# Patient Record
Sex: Female | Born: 1995 | Race: White | Hispanic: No | Marital: Single | Smoking: Never smoker
Health system: Southern US, Community
[De-identification: ages and names within clinical notes are randomized; demographics above are authoritative.]

## PROBLEM LIST (undated history)

## (undated) DIAGNOSIS — F419 Anxiety disorder, unspecified: Secondary | ICD-10-CM

## (undated) DIAGNOSIS — F99 Mental disorder, not otherwise specified: Secondary | ICD-10-CM

## (undated) DIAGNOSIS — F32A Depression, unspecified: Secondary | ICD-10-CM

## (undated) DIAGNOSIS — F319 Bipolar disorder, unspecified: Secondary | ICD-10-CM

## (undated) DIAGNOSIS — Z803 Family history of malignant neoplasm of breast: Secondary | ICD-10-CM

## (undated) DIAGNOSIS — L709 Acne, unspecified: Secondary | ICD-10-CM

## (undated) HISTORY — DX: Family history of malignant neoplasm of breast: Z80.3

## (undated) HISTORY — DX: Acne, unspecified: L70.9

## (undated) HISTORY — PX: TONSILLECTOMY: SUR1361

---
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
## 2016-08-17 ENCOUNTER — Emergency Department (HOSPITAL_COMMUNITY)
Admission: EM | Admit: 2016-08-17 | Discharge: 2016-08-17 | Disposition: A | Discharge status: Home / Self Care | Attending: Emergency Medicine | Admitting: Emergency Medicine

## 2016-08-17 ENCOUNTER — Encounter: Payer: Self-pay | Admitting: Emergency Medicine

## 2016-08-17 DIAGNOSIS — O368120 Decreased fetal movements, second trimester, not applicable or unspecified: Secondary | ICD-10-CM | POA: Insufficient documentation

## 2016-08-17 DIAGNOSIS — Z3A25 25 weeks gestation of pregnancy: Secondary | ICD-10-CM | POA: Diagnosis not present

## 2016-08-17 DIAGNOSIS — Z3492 Encounter for supervision of normal pregnancy, unspecified, second trimester: Secondary | ICD-10-CM

## 2016-08-17 HISTORY — DX: Mental disorder, not otherwise specified: F99

## 2016-08-17 NOTE — Progress Notes (Addendum)
1215 Arrived to evaluate this 20 yo G1P0 @ 25.[redacted] wks GA in with complaint of no fetal movement for 2 days.  Patient remains in waiting room upon arrival of [facility] RN.  1235  Bedside limited U/S performed by ED provider.  Fetal movement and heartbeat detected.  1241  EFM placed. 1310 Dr. Debroah LoopArnold of FP notified of above and of reassuring FHR for GA.  He reviewed the fetal monitoring strip at this time and indicates that the patient can be OB cleared at this time.

## 2016-08-17 NOTE — ED Triage Notes (Signed)
Pt reports she is [redacted] weeks pregnant and has not felt her baby move for two days. She denies any abdominal pain, vaginal bleeding or leakage of any fluids. She [place] [place].

## 2016-08-17 NOTE — Discharge Instructions (Signed)
Please follow up with your Primary OB/Gyn as needed. If you have further issues while in [place], please [facility]

## 2016-08-17 NOTE — ED Notes (Signed)
OB rapid response at bedside 

## 2016-08-17 NOTE — ED Triage Notes (Signed)
This RN has called [facility] and spoke with Denny PeonErin, RN to inform her that pt is here. Denny Peonrin states she will be here shortly to see patient.

## 2016-08-17 NOTE — ED Provider Notes (Signed)
[facility]PT Provider Note   CSN: [id number] Arrival date & time: 08/17/16  1131     History   Chief Complaint Chief Complaint  Patient presents with  . Pregnancy problem     HPI   Chelsea Vargas is a 20 y.o. G1P0 female with bipolar disorder and anxiety and pregnancy at 25 weeks presents for inability to feel her baby move for the last two days She denies contractions, vaginal bleeding, vaginal irritation or discharge dysuria. She states she has had prenatal care in [place] but is [facility] visiting a family member who is in the hospital. She denies recent illness or fevers, nausea, emesis  No past medical history on file.  There are no active problems to display for this patient.   No past surgical history on file.  OB History    Gravida Para Term Preterm AB Living   1             SAB TAB Ectopic Multiple Live Births                   Home Medications    Prior to Admission medications   Not on File    Family History No family history on file.  Social History Social History  Substance Use Topics  . Smoking status: Not on file  . Smokeless tobacco: Not on file  . Alcohol use Not on file     Allergies   Review of patient's allergies indicates not on file.   Review of Systems Review of Systems  Constitutional: Negative.   HENT: Negative.   Respiratory: Negative.   Cardiovascular: Negative.   Gastrointestinal: Negative.   Genitourinary: Negative.   Musculoskeletal: Negative.   Skin: Negative.   Neurological: Negative.      Physical Exam Updated Vital Signs BP 123/74 (BP Location: Right Arm)   Pulse 93   Temp 98.5 F (36.9 C) (Oral)   Resp 18   LMP 02/04/2016   SpO2 98%   Physical Exam  Constitutional: She is oriented to person, place, and time. She appears well-developed and well-nourished.  HENT:  Head: Normocephalic and atraumatic.  Eyes: EOM are normal. Pupils are equal, round, and reactive to light.  Neck:  Normal range of motion.  Cardiovascular: Normal rate, regular rhythm and normal heart sounds.   No murmur heard. Pulmonary/Chest: Effort normal and breath sounds normal. No respiratory distress.  Abdominal: Soft. Bowel sounds are normal. She exhibits no distension. There is no tenderness.  Genitourinary: Vagina normal and uterus normal.  Musculoskeletal: Normal range of motion.  Neurological: She is alert and oriented to person, place, and time.  Psychiatric: She has a normal mood and affect.     ED Treatments / Results  Labs (all labs ordered are listed, but only abnormal results are displayed) Labs Reviewed - No data to display  EKG  EKG Interpretation None       Radiology No results found.  Procedures Procedures (including critical care time)  Medications Ordered in ED Medications - No data to display   Initial Impression / Assessment and Plan / ED Course  I have reviewed the triage vital signs and the nursing notes.  Pertinent labs & imaging results that were available during my care of the patient were reviewed by me and considered in my medical decision making (see chart for details).  Clinical Course   Bedside US showed fetal heart rate in the 140s, vigorous fetus and good amniotic fluid  Final Clinical Impressions(s) / ED Diagnoses   Final diagnoses:  None   20 y/o G1P0 at 25 weeks presents for concern of no fetal movements for 2 days. Bedside US showed normal fetus. The patient was evaluated by the [facility] nurse and placed on the fetal monitors for 20 minutes. She was given strong ED and OB return precautions and was discharged in stable condition  New Prescriptions New Prescriptions   No medications on file    Kyson Kupper A. Kennon Rounds MD, MS Family Medicine Resident PGY-3 Pager [phone]    Bonney Aid, MD 08/17/16 1249    Jacalyn Lefevre, MD 08/17/16 1319    Jacalyn Lefevre, MD 08/22/16 [phone]

## 2016-12-24 ENCOUNTER — Encounter (HOSPITAL_COMMUNITY): Payer: Self-pay | Admitting: Emergency Medicine

## 2016-12-24 ENCOUNTER — Emergency Department (HOSPITAL_COMMUNITY)
Admission: EM | Admit: 2016-12-24 | Discharge: 2016-12-25 | Disposition: A | Discharge status: Home / Self Care | Attending: Emergency Medicine | Admitting: Emergency Medicine

## 2016-12-24 DIAGNOSIS — R1031 Right lower quadrant pain: Secondary | ICD-10-CM | POA: Insufficient documentation

## 2016-12-24 DIAGNOSIS — R1030 Lower abdominal pain, unspecified: Secondary | ICD-10-CM

## 2016-12-24 LAB — CBC
HEMATOCRIT: 36.5 % (ref 36.0–46.0)
Hemoglobin: 11.2 g/dL — ABNORMAL LOW (ref 12.0–15.0)
MCH: 25.1 pg — ABNORMAL LOW (ref 26.0–34.0)
MCHC: 30.7 g/dL (ref 30.0–36.0)
MCV: 81.7 fL (ref 78.0–100.0)
PLATELETS: 292 10*3/uL (ref 150–400)
RBC: 4.47 MIL/uL (ref 3.87–5.11)
RDW: 13.2 % (ref 11.5–15.5)
WBC: 9.3 10*3/uL (ref 4.0–10.5)

## 2016-12-24 NOTE — ED Provider Notes (Signed)
[facility] Provider Note   CSN: [id number] Arrival date & time: 12/24/16  2230 By signing my name below, I, Chelsea Vargas, attest that this documentation has been prepared under the direction and in the presence of Gilda Crease, MD. Electronically Signed: Bridgette Vargas, ED Scribe. 12/25/16. 12:04 AM.  History   Chief Complaint Chief Complaint  Patient presents with  . Abdominal Pain    HPI The history is provided by the patient. No language interpreter was used.   HPI Comments: Chelsea Vargas is a 21 y.o. female with no pertinent PMHx, who presents to the Emergency Department complaining of RLQ abdominal pain onset last night. Mother at bedside reports that pt took a nap yesterday afternoon and woke up with this pain. Pain is exacerbated with movement and ambulating. Additionally, mother states that pt had vaginal yellow discharge that began two days ago with some mild dysuria. No aleviating factors noted. Mother notes that pt had a C-section 4 weeks ago and has been recovering well; she has not taken pain medication in 2 weeks. Denies any drainage from incision site. Denies h/o ovarian cysts. Pt further denies nausea, vomiting, fever, chills, or any other associated symptoms.   Past Medical History:  Diagnosis Date  . Mental disorder    Bipolar    There are no active problems to display for this patient.   Past Surgical History:  Procedure Laterality Date  . CESAREAN SECTION    . TONSILLECTOMY      OB History    Gravida Para Term Preterm AB Living   1             SAB TAB Ectopic Multiple Live Births                   Home Medications    Prior to Admission medications   Medication Sig Start Date End Date Taking? Authorizing Provider  Prenatal Vit-Fe Fumarate-FA (PRENATAL MULTIVITAMIN) TABS tablet Take 1 tablet by mouth daily at 12 noon.   Yes Historical Provider, MD  sertraline (ZOLOFT) 50 MG tablet Take 50 mg by mouth daily.   Yes Historical Provider, MD    amoxicillin-clavulanate (AUGMENTIN) 875-125 MG tablet Take 1 tablet by mouth 2 (two) times daily. 12/25/16   Gilda Crease, MD  HYDROcodone-acetaminophen (NORCO/VICODIN) 5-325 MG tablet Take 1-2 tablets by mouth every 4 (four) hours as needed for moderate pain. 12/25/16   Gilda Crease, MD    Family History No family history on file.  Social History Social History  Substance Use Topics  . Smoking status: Never Smoker  . Smokeless tobacco: Never Used  . Alcohol use No     Allergies   Patient has no known allergies.   Review of Systems Review of Systems  Constitutional: Negative for chills and fever.  Gastrointestinal: Positive for abdominal pain. Negative for nausea and vomiting.  Genitourinary: Positive for dysuria and vaginal discharge.  All other systems reviewed and are negative.  Physical Exam Updated Vital Signs BP (!) 92/53   Pulse 66   Temp 97.9 F (36.6 C) (Oral)   Resp 16   Ht 5\' 3"  (1.6 m)   Wt 150 lb (68 kg)   LMP 02/04/2016   SpO2 99%   Breastfeeding? No   BMI 26.57 kg/m   Physical Exam  Constitutional: She is oriented to person, place, and time. She appears well-developed and well-nourished. No distress.  HENT:  Head: Normocephalic and atraumatic.  Right Ear: Hearing normal.  Left  Ear: Hearing normal.  Nose: Nose normal.  Mouth/Throat: Oropharynx is clear and moist and mucous membranes are normal.  Eyes: Conjunctivae and EOM are normal. Pupils are equal, round, and reactive to light.  Neck: Normal range of motion. Neck supple.  Cardiovascular: Regular rhythm, S1 normal and S2 normal.  Exam reveals no gallop and no friction rub.   No murmur heard. Pulmonary/Chest: Effort normal and breath sounds normal. No respiratory distress. She exhibits no tenderness.  Abdominal: Soft. Normal appearance and bowel sounds are normal. There is no hepatosplenomegaly. There is tenderness in the right lower quadrant. There is no rebound, no guarding, no  tenderness at McBurney's point and negative Murphy's sign. No hernia.  Genitourinary: Cervix exhibits no motion tenderness and no discharge. Right adnexum displays no mass. Left adnexum displays no mass.  Musculoskeletal: Normal range of motion.  Neurological: She is alert and oriented to person, place, and time. She has normal strength. No cranial nerve deficit or sensory deficit. Coordination normal. GCS eye subscore is 4. GCS verbal subscore is 5. GCS motor subscore is 6.  Skin: Skin is warm, dry and intact. No rash noted. No cyanosis.  Psychiatric: She has a normal mood and affect. Her speech is normal and behavior is normal. Thought content normal.  Nursing note and vitals reviewed.    ED Treatments / Results  DIAGNOSTIC STUDIES: Oxygen Saturation is 99% on RA, normal by my interpretation.    COORDINATION OF CARE: 12:02 AM Discussed treatment plan with pt at bedside which includes lab work and abdomen CT and pt agreed to plan.  Labs (all labs ordered are listed, but only abnormal results are displayed) Labs Reviewed  WET PREP, GENITAL - Abnormal; Notable for the following:       Result Value   WBC, Wet Prep HPF POC MANY (*)    All other components within normal limits  COMPREHENSIVE METABOLIC PANEL - Abnormal; Notable for the following:    ALT 13 (*)    All other components within normal limits  CBC - Abnormal; Notable for the following:    Hemoglobin 11.2 (*)    MCH 25.1 (*)    All other components within normal limits  URINALYSIS, ROUTINE W REFLEX MICROSCOPIC - Abnormal; Notable for the following:    APPearance HAZY (*)    Leukocytes, UA TRACE (*)    Bacteria, UA RARE (*)    Squamous Epithelial / LPF 6-30 (*)    All other components within normal limits  LIPASE, BLOOD  POC URINE PREG, ED  GC/CHLAMYDIA PROBE AMP ([place]) NOT AT [facility]    EKG  EKG Interpretation None       Radiology Ct Abdomen Pelvis W Contrast  Result Date: 12/25/2016 CLINICAL DATA:  Right  lower quadrant pain.  Recent Cesarean section. EXAM: CT ABDOMEN AND PELVIS WITH CONTRAST TECHNIQUE: Multidetector CT imaging of the abdomen and pelvis was performed using the standard protocol following bolus administration of intravenous contrast. CONTRAST:  ISOVUE-300 IOPAMIDOL (ISOVUE-300) INJECTION 61% COMPARISON:  None. FINDINGS: Lower chest: No acute abnormality. Hepatobiliary: No focal liver abnormality is seen. No gallstones, gallbladder wall thickening, or biliary dilatation. Pancreas: Unremarkable. No pancreatic ductal dilatation or surrounding inflammatory changes. Spleen: Normal in size without focal abnormality. Adrenals/Urinary Tract: Adrenal glands are unremarkable. Kidneys are normal, without renal calculi, focal lesion, or hydronephrosis. Bladder is unremarkable. Stomach/Bowel: Stomach is within normal limits. Appendix appears normal. No evidence of bowel wall thickening, distention, or inflammatory changes. Vascular/Lymphatic: No significant vascular findings are  present. No enlarged abdominal or pelvic lymph nodes. Reproductive: Mildly prominent uterine size, consistent with the provided history. C-section scar is visible at the anterior uterus. No hematoma, abscess or other abnormal postoperative collection is evident. Physiologic appearing ovarian cysts or follicles are present bilaterally. Other: No focal inflammation. Fat containing umbilical hernia incidentally noted. Musculoskeletal: No significant skeletal lesions. IMPRESSION: Unremarkable appearances of the uterus, given the history of recent Cesarean section. No evidence of significant complication. No acute findings. Small fat containing umbilical hernia incidentally noted. Electronically Signed   By: Ellery Plunkaniel R Mitchell M.D.   On: 12/25/2016 03:49    Procedures Procedures (including critical care time)  Medications Ordered in ED Medications  iopamidol (ISOVUE-300) 61 % injection (not administered)  morphine 4 MG/ML  injection 2 mg (2 mg Intravenous Given 12/25/16 0100)  ondansetron (ZOFRAN) injection 4 mg (4 mg Intravenous Given 12/25/16 0100)  iopamidol (ISOVUE-300) 61 % injection (100 mLs  Contrast Given 12/25/16 0329)     Initial Impression / Assessment and Plan / ED Course  I have reviewed the triage vital signs and the nursing notes.  Pertinent labs & imaging results that were available during my care of the patient were reviewed by me and considered in my medical decision making (see chart for details).     Patient presents to the emergency department for evaluation of right-sided abdominal and pelvic pain. Patient had C-section performed in [place] 4 weeks ago. She had been doing well postoperatively. No redness or drainage from the surgical site. She still has some yellowish vaginal discharge that has been present since C-section. She does report some odor. She has not had any fevers. Pelvic exam did not reveal any significant uterine tenderness and no significant discharge was noted. Low suspicion for endometritis, but patient is currently visiting here and does not have access to follow-up with her OB/GYN until next week when she goes back to [place]. Will therefore empirically cover with antibiotics.  Final Clinical Impressions(s) / ED Diagnoses   Final diagnoses:  Lower abdominal pain    New Prescriptions New Prescriptions   AMOXICILLIN-CLAVULANATE (AUGMENTIN) 875-125 MG TABLET    Take 1 tablet by mouth 2 (two) times daily.   HYDROCODONE-ACETAMINOPHEN (NORCO/VICODIN) 5-325 MG TABLET    Take 1-2 tablets by mouth every 4 (four) hours as needed for moderate pain.   I personally performed the services described in this documentation, which was scribed in my presence. The recorded information has been reviewed and is accurate.     Gilda Creasehristopher J Karon Heckendorn, MD 12/25/16 [phone]

## 2016-12-24 NOTE — ED Triage Notes (Signed)
Patient with lower right quadrant abdominal pain.  Patient denies any nausea or vomiting at this time.  Patient states she had a C section 4 weeks ago.  No drainage from incision site per patient.  Patient states that she has some vaginal discharge.

## 2016-12-25 ENCOUNTER — Emergency Department (HOSPITAL_COMMUNITY)

## 2016-12-25 LAB — COMPREHENSIVE METABOLIC PANEL
ALBUMIN: 3.9 g/dL (ref 3.5–5.0)
ALK PHOS: 81 U/L (ref 38–126)
ALT: 13 U/L — ABNORMAL LOW (ref 14–54)
ANION GAP: 9 (ref 5–15)
AST: 18 U/L (ref 15–41)
BILIRUBIN TOTAL: 0.3 mg/dL (ref 0.3–1.2)
BUN: 11 mg/dL (ref 6–20)
CALCIUM: 9.4 mg/dL (ref 8.9–10.3)
CO2: 23 mmol/L (ref 22–32)
Chloride: 108 mmol/L (ref 101–111)
Creatinine, Ser: 0.68 mg/dL (ref 0.44–1.00)
GFR calc Af Amer: >60 mL/min (ref >60)
GLUCOSE: 84 mg/dL (ref 65–99)
Potassium: 4 mmol/L (ref 3.5–5.1)
Sodium: 140 mmol/L (ref 135–145)
Total Protein: 6.5 g/dL (ref 6.5–8.1)

## 2016-12-25 LAB — WET PREP, GENITAL
CLUE CELLS WET PREP: NONE SEEN
SPERM: NONE SEEN
TRICH WET PREP: NONE SEEN
YEAST WET PREP: NONE SEEN

## 2016-12-25 LAB — URINALYSIS, ROUTINE W REFLEX MICROSCOPIC
Bilirubin Urine: NEGATIVE
Glucose, UA: NEGATIVE mg/dL
Hgb urine dipstick: NEGATIVE
Ketones, ur: NEGATIVE mg/dL
Nitrite: NEGATIVE
PH: 6 (ref 5.0–8.0)
Protein, ur: NEGATIVE mg/dL
SPECIFIC GRAVITY, URINE: 1.018 (ref 1.005–1.030)

## 2016-12-25 LAB — POC URINE PREG, ED: Preg Test, Ur: NEGATIVE

## 2016-12-25 LAB — LIPASE, BLOOD: Lipase: 25 U/L (ref 11–51)

## 2016-12-25 MED ORDER — ONDANSETRON HCL 4 MG/2ML IJ SOLN
4.0000 mg | Freq: Once | INTRAMUSCULAR | Status: AC
  Administered 2016-12-25: 4 mg via INTRAVENOUS
  Filled 2016-12-25: qty 2

## 2016-12-25 MED ORDER — HYDROCODONE-ACETAMINOPHEN 5-325 MG PO TABS: 1.0000 | ORAL_TABLET | ORAL | 0 refills | Status: DC | PRN

## 2016-12-25 MED ORDER — IOPAMIDOL (ISOVUE-300) INJECTION 61%
INTRAVENOUS | Status: AC
  Administered 2016-12-25: 100 mL
  Filled 2016-12-25: qty 100

## 2016-12-25 MED ORDER — AMOXICILLIN-POT CLAVULANATE 875-125 MG PO TABS: 1.0000 | ORAL_TABLET | Freq: Two times a day (BID) | ORAL | 0 refills | Status: DC

## 2016-12-25 MED ORDER — MORPHINE SULFATE (PF) 4 MG/ML IV SOLN
2.0000 mg | Freq: Once | INTRAVENOUS | Status: AC
  Administered 2016-12-25: 2 mg via INTRAVENOUS
  Filled 2016-12-25: qty 1

## 2016-12-25 MED ORDER — IOPAMIDOL (ISOVUE-300) INJECTION 61%
INTRAVENOUS | Status: AC
  Filled 2016-12-25: qty 30

## 2016-12-25 NOTE — ED Notes (Signed)
ED Provider at bedside. 

## 2016-12-25 NOTE — ED Notes (Signed)
Patient transported to CT 

## 2016-12-25 NOTE — ED Notes (Signed)
Pelvic cart outside room & ready for exam.

## 2016-12-25 NOTE — ED Notes (Signed)
Drink second bottle at 0230, scan at 0300

## 2016-12-26 LAB — GC/CHLAMYDIA PROBE AMP (~~LOC~~) NOT AT ARMC
Chlamydia: NEGATIVE
Neisseria Gonorrhea: NEGATIVE

## 2018-01-02 ENCOUNTER — Other Ambulatory Visit: Payer: Self-pay | Admitting: Obstetrics and Gynecology

## 2018-01-02 DIAGNOSIS — R102 Pelvic and perineal pain: Secondary | ICD-10-CM

## 2018-01-03 ENCOUNTER — Encounter: Payer: Self-pay | Admitting: Obstetrics and Gynecology

## 2018-01-03 ENCOUNTER — Ambulatory Visit (INDEPENDENT_AMBULATORY_CARE_PROVIDER_SITE_OTHER)

## 2018-01-03 ENCOUNTER — Ambulatory Visit (INDEPENDENT_AMBULATORY_CARE_PROVIDER_SITE_OTHER): Admitting: Obstetrics and Gynecology

## 2018-01-03 VITALS — BP 130/78 | HR 68 | Ht 64.0 in | Wt 200.0 lb

## 2018-01-03 DIAGNOSIS — Z113 Encounter for screening for infections with a predominantly sexual mode of transmission: Secondary | ICD-10-CM

## 2018-01-03 DIAGNOSIS — N7011 Chronic salpingitis: Secondary | ICD-10-CM | POA: Diagnosis not present

## 2018-01-03 DIAGNOSIS — R102 Pelvic and perineal pain: Secondary | ICD-10-CM

## 2018-01-03 DIAGNOSIS — N898 Other specified noninflammatory disorders of vagina: Secondary | ICD-10-CM

## 2018-01-03 LAB — POCT WET PREP WITH KOH
CLUE CELLS WET PREP PER HPF POC: NEGATIVE
KOH Prep POC: NEGATIVE
Trichomonas, UA: NEGATIVE
Yeast Wet Prep HPF POC: NEGATIVE

## 2018-01-03 MED ORDER — METRONIDAZOLE 0.75 % VA GEL: 1.0000 | Freq: Every day | VAGINAL | 1 refills | Status: AC

## 2018-01-03 NOTE — Progress Notes (Signed)
Chief Complaint  Patient presents with  . Pain in ovary    U/S today    HPI:      [name] is a 22 y.o. G1P0 who LMP was No LMP recorded., presents today for pelvic pain for the past 2 wks. Pain is sharp and constant, affecting sleep. No meds taken for sx. She has also noticed a vaginal non-fishy odor for 7 months and has seen other GYNs without any dx or tx. No vag d/c, irritation. Uses scented body wash vaginally. No urin sx. No fevers. Has had LBP for 7 months as well as LUQ pain. Does have frequent diarrhea, sx for "awhile".   Menses are monthly, last 4 days. No dysmen. Sex active, uses OCPs. No new partners. Has had neg STD testing in last yr. Has had dyspareunia recently.    Past Medical History:  Diagnosis Date  . Mental disorder    Bipolar    Past Surgical History:  Procedure Laterality Date  . CESAREAN SECTION    . TONSILLECTOMY      History reviewed. No pertinent family history.  Social History   Socioeconomic History  . Marital status: Single    Spouse name: Not on file  . Number of children: Not on file  . Years of education: Not on file  . Highest education level: Not on file  Social Needs  . Financial resource strain: Not on file  . Food insecurity - worry: Not on file  . Food insecurity - inability: Not on file  . Transportation needs - medical: Not on file  . Transportation needs - non-medical: Not on file  Occupational History  . Not on file  Tobacco Use  . Smoking status: Never Smoker  . Smokeless tobacco: Never Used  Substance and Sexual Activity  . Alcohol use: No  . Drug use: No  . Sexual activity: Yes  Other Topics Concern  . Not on file  Social History Narrative  . Not on file     Current Outpatient Medications:  .  citalopram (CELEXA) 20 MG tablet, TK 2 TS ONCE DAILY   -STOP LEXAPRO, Disp: , Rfl: 0 .  ESTARYLLA 0.25-35 MG-MCG tablet, TK 1 T PO QD, Disp: , Rfl: 12 .  metroNIDAZOLE (METROGEL) 0.75 % vaginal gel, Place 1  Applicatorful vaginally at bedtime for 5 days., Disp: 70 g, Rfl: 1   ROS:  Review of Systems  Constitutional: Negative for fever.  Gastrointestinal: Positive for diarrhea. Negative for blood in stool, constipation, nausea and vomiting.  Genitourinary: Positive for pelvic pain. Negative for dyspareunia, dysuria, flank pain, frequency, hematuria, urgency, vaginal bleeding, vaginal discharge and vaginal pain.  Musculoskeletal: Positive for back pain.  Skin: Negative for rash.     OBJECTIVE:   Vitals:  BP 130/78 (BP Location: Left Arm, Patient Position: Sitting, Cuff Size: Normal)   Pulse 68   Ht 5\' 4"  (1.626 m)   Wt 200 lb (90.7 kg)   BMI 34.33 kg/m   Physical Exam  Constitutional: She is oriented to person, place, and time and well-developed, well-nourished, and in no distress. Vital signs are normal.  Abdominal: Soft. Normal appearance. She exhibits no mass. There is tenderness in the right lower quadrant, suprapubic area and left lower quadrant. There is no guarding.  Genitourinary: Vagina normal, cervix normal and vulva normal. Uterus is tender. Uterus is not enlarged. Cervix exhibits no motion tenderness and no tenderness. Right adnexum displays tenderness. Right adnexum displays no mass. Left adnexum  displays tenderness. Left adnexum displays no mass. Vulva exhibits no erythema, no exudate, no lesion, no rash and no tenderness. Vagina exhibits no lesion. No vaginal discharge found.  Neurological: She is oriented to person, place, and time.  Vitals reviewed.   Results: ULTRASOUND REPORT  Location: [facility]  Date of Service: 01/03/2018    Indications:Pelvic Pain Findings:  The uterus is anteverted and measures 9.0 x 5.10 x 4.33cm. Echo texture is homogenous without evidence of focal masses.  The Endometrium measures 4.55 mm.  Right Ovary measures 2.75 x 2.06 x 1.37 cm. It is normal in appearance. - Right fallopian tube is dilated (.64 x .76cm) Left Ovary  measures 2.78 x 2.00 x 1.92 cm. It is normal in appearance. Survey of the adnexa demonstrates no adnexal masses. There is no free fluid in the cul de sac.  Impression: 1. Right hydrosalpinx, otherwise normal gyn ultrasound  Recommendations: 1.Clinical correlation with the patient's History and Physical Exam.   Willette AlmaKristen Priestley, RDMS, RVT  Results for orders placed or performed in visit on 01/03/18 (from the past 24 hour(s))  POCT Wet Prep with KOH     Status: Normal   Collection Time: 01/03/18  5:09 PM  Result Value Ref Range   Trichomonas, UA Negative    Clue Cells Wet Prep HPF POC neg    Epithelial Wet Prep HPF POC  Few, Moderate, Many, Too numerous to count   Yeast Wet Prep HPF POC neg    Bacteria Wet Prep HPF POC  Few   RBC Wet Prep HPF POC     WBC Wet Prep HPF POC     KOH Prep POC Negative Negative     Assessment/Plan: Pelvic pain - RT hydrosalpinx on u/s. Check nuswab. Will f/u with resutls. NSAIDs.  - Plan: US PELVIS TRANSVANGINAL NON-OB (TV ONLY), Chlamydia/Gonococcus/Trichomonas, NAA  Hydrosalpinx - Plan: Chlamydia/Gonococcus/Trichomonas, NAA  Screening for STD (sexually transmitted disease) - Plan: Chlamydia/Gonococcus/Trichomonas, NAA  Vaginal odor - NEg wet prep. Treat empirically with Rx metrogel. Will f/u with sx with STD results. If no improvement, will send One Swab AV, BV culture. Dove sens skin soap - Plan: POCT Wet Prep with KOH, metroNIDAZOLE (METROGEL) 0.75 % vaginal gel    Meds ordered this encounter  Medications  . metroNIDAZOLE (METROGEL) 0.75 % vaginal gel    Sig: Place 1 Applicatorful vaginally at bedtime for 5 days.    Dispense:  70 g    Refill:  1      Return if symptoms worsen or fail to improve.  Garland Smouse B. Alexah Kivett, PA-C 01/03/2018 5:12 PM

## 2018-01-03 NOTE — Patient Instructions (Signed)
I value your feedback and entrusting us with your care. If you get a [facility] patient survey, I would appreciate you taking the time to let us know about your experience today. Thank you! 

## 2018-01-06 LAB — CHLAMYDIA/GONOCOCCUS/TRICHOMONAS, NAA
Chlamydia by NAA: NEGATIVE
Gonococcus by NAA: NEGATIVE
Trich vag by NAA: NEGATIVE

## 2018-01-07 ENCOUNTER — Telehealth: Payer: Self-pay | Admitting: Obstetrics and Gynecology

## 2018-01-07 NOTE — Telephone Encounter (Signed)
LMTRC

## 2018-01-07 NOTE — Telephone Encounter (Signed)
Pt is calling back for test results. Please advise

## 2018-01-08 NOTE — Telephone Encounter (Signed)
Patient is calling [facility] back for results. Please advise

## 2018-01-08 NOTE — Telephone Encounter (Signed)
LMTRC

## 2018-01-08 NOTE — Telephone Encounter (Signed)
Pt aware of neg gon/chlam results. Pt aware of poss paratubal cyst instead of hydrosalpinx. No further u/s needed. Still having pelvic pain. Started period last wk so hasn't treated with metrogel yet. Should stop bleeding tomorrow. Start metrogel tomorrow for [employer]ag odor. If BV (had neg wet prep), could cause pelvic discomfort. Pt to f/u if sx persist.

## 2018-03-04 ENCOUNTER — Ambulatory Visit: Admitting: Obstetrics and Gynecology

## 2018-03-25 IMAGING — CT CT ABD-PELV W/ CM
2 of 4 series · 16 of 46 positions shown, 18 images · IV contrast (Omni 300)
Comparison: None.

CLINICAL DATA: Right lower quadrant pain.  Recent Cesarean section.

EXAM:
CT ABDOMEN AND PELVIS WITH CONTRAST
TECHNIQUE: Multidetector CT imaging of the abdomen and pelvis was performed
using the standard protocol following bolus administration of
intravenous contrast.
CONTRAST:  100mL XSRHIR-J11 IOPAMIDOL (XSRHIR-J11) INJECTION 61%

[Series 2: a/p w/ 5mm · axial · 0.89mm/px · z∈[+563,+968]mm · 13 of 91 slices shown, 15 images]
[im 5/91  soft-tissue]
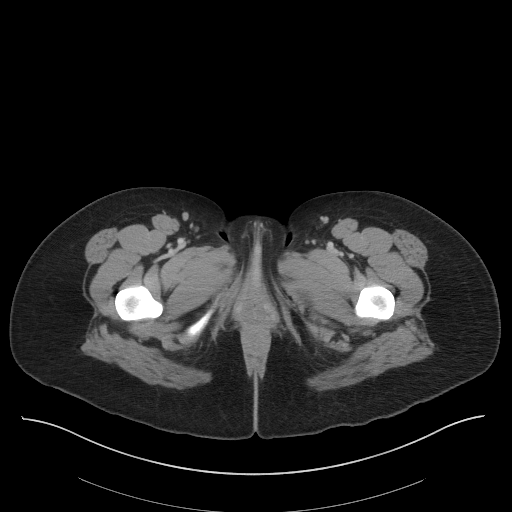
[im 5/91  bone]
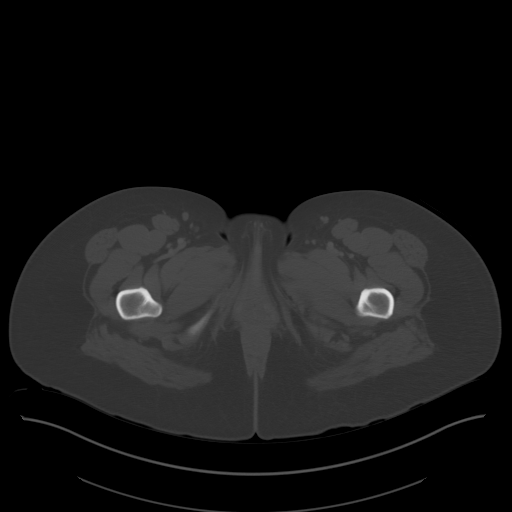
[im 15/91  soft-tissue]
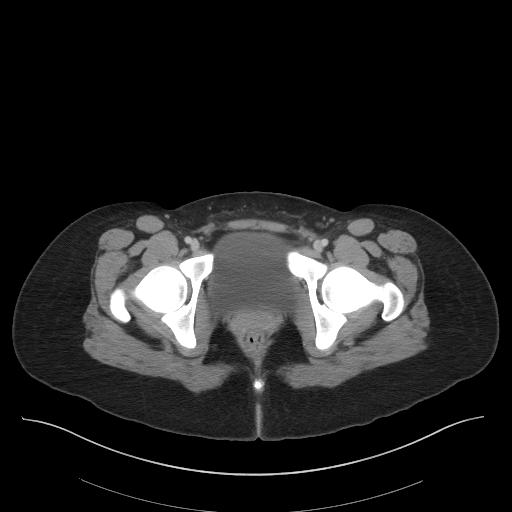
[im 19/91  soft-tissue]
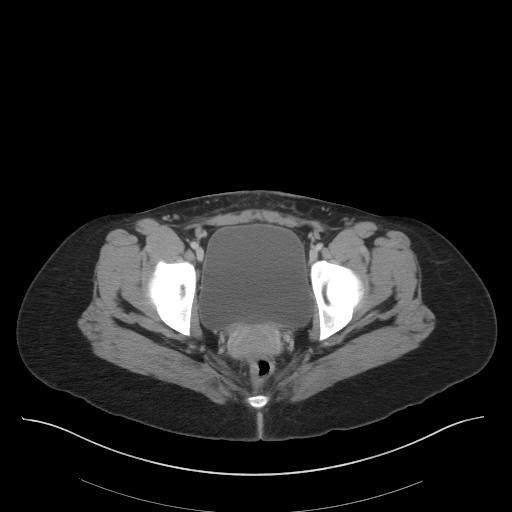
[im 24/91  soft-tissue]
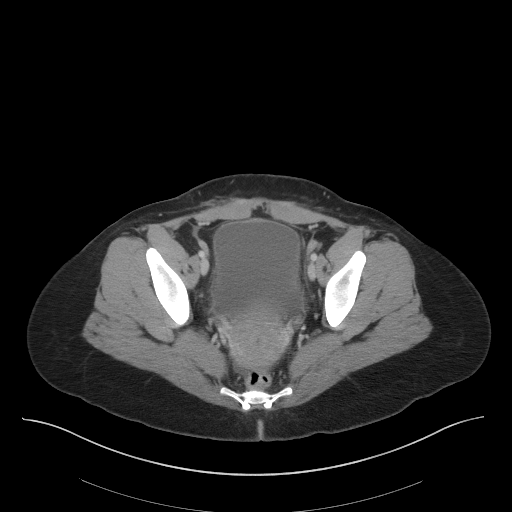
[im 34/91  soft-tissue]
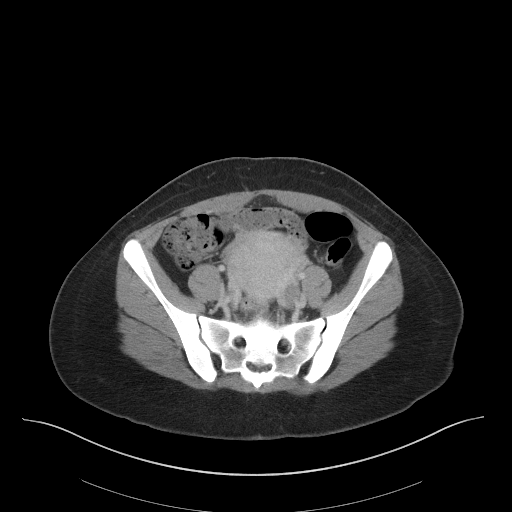
[im 38/91  soft-tissue]
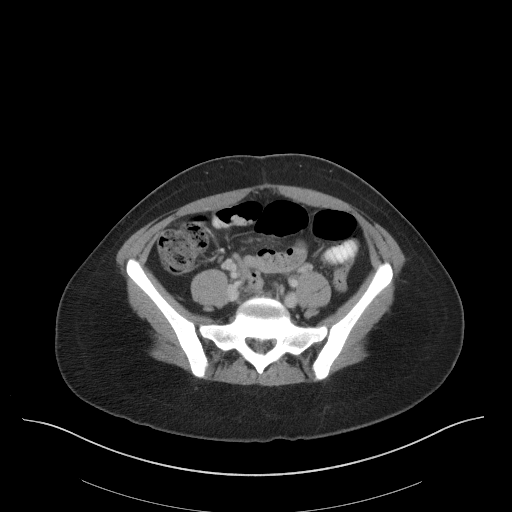
[im 48/91  soft-tissue]
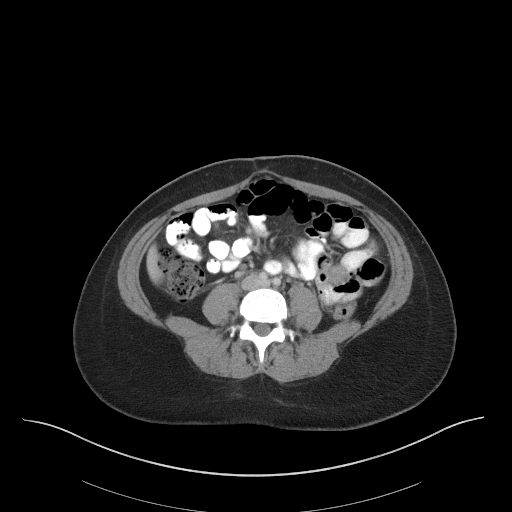
[im 53/91  soft-tissue]
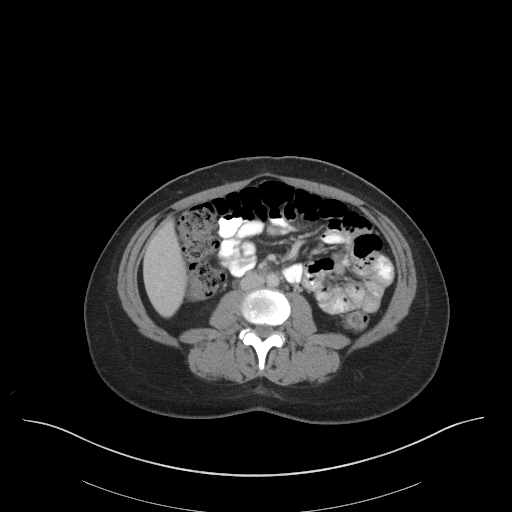
[im 57/91  soft-tissue]
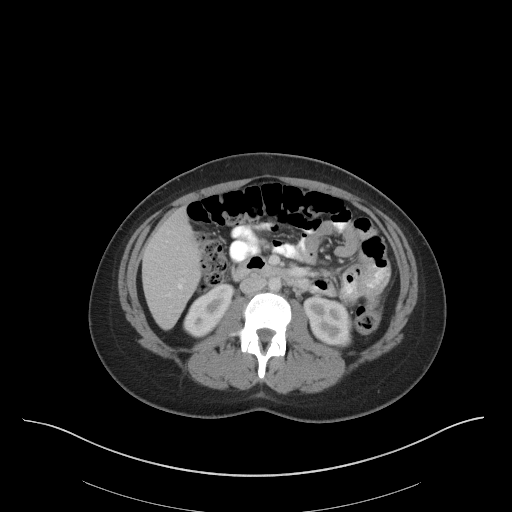
[im 57/91  bone]
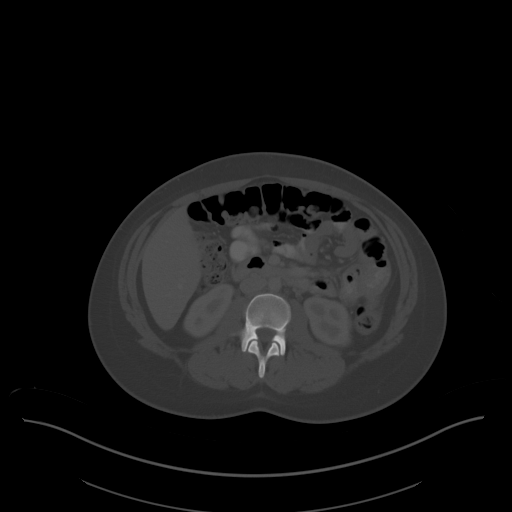
[im 67/91  soft-tissue]
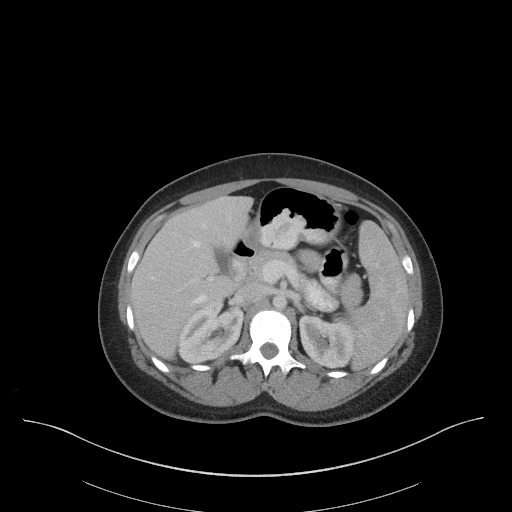
[im 72/91  soft-tissue]
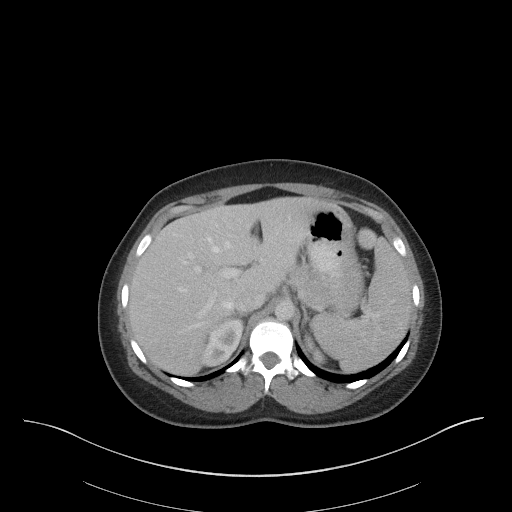
[im 76/91  soft-tissue]
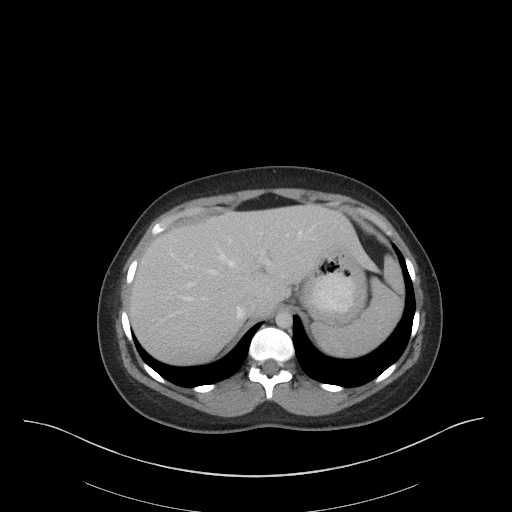
[im 86/91  soft-tissue]
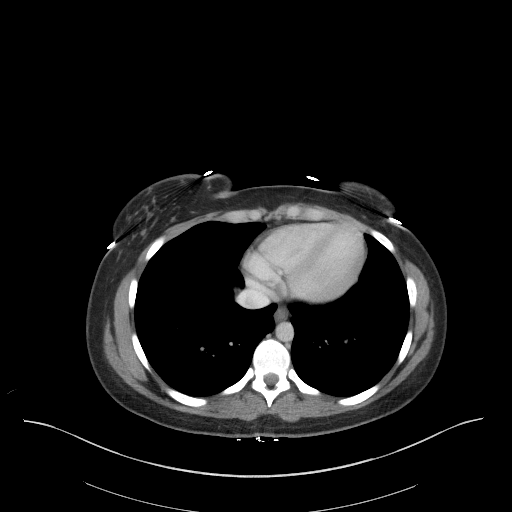

[Series 5: a/p w/ cor · coronal · 0.73mm/px · 3 of 120 slices shown]
[im 40/120  soft-tissue]
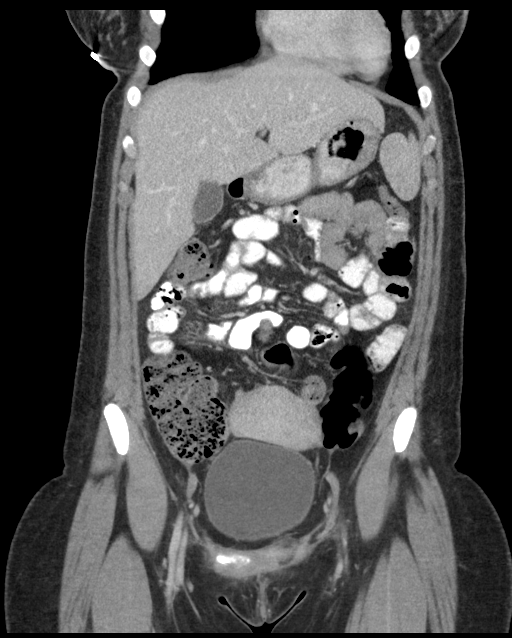
[im 53/120  soft-tissue]
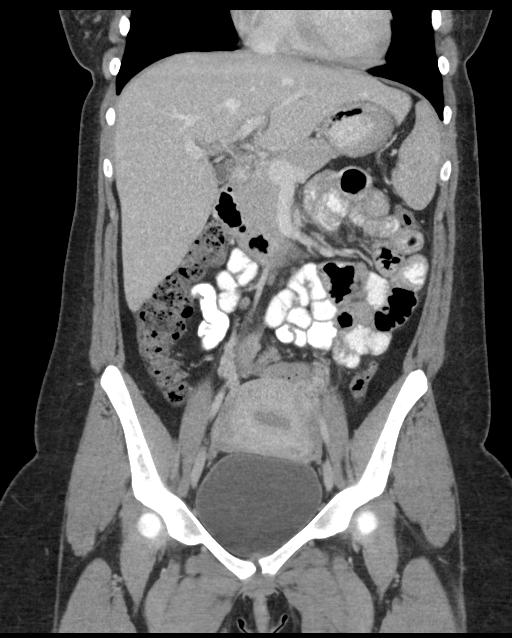
[im 67/120  soft-tissue]
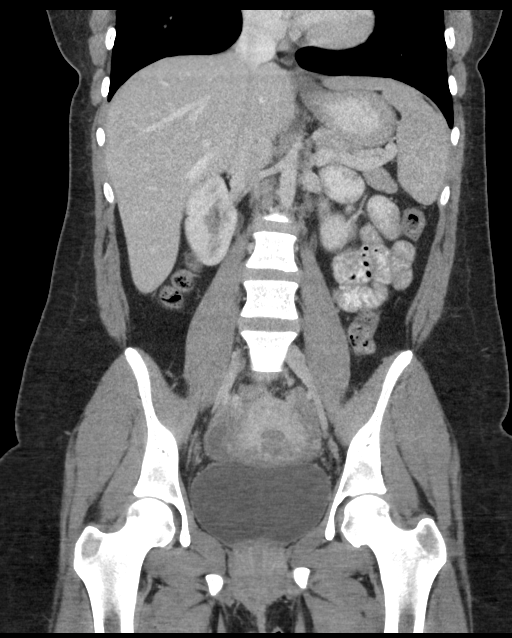

[16 of 46 positions shown; findings below may reference images not displayed]

FINDINGS: Lower chest: No acute abnormality.

Hepatobiliary: No focal liver abnormality is seen. No gallstones,
gallbladder wall thickening, or biliary dilatation.

Pancreas: Unremarkable. No pancreatic ductal dilatation or
surrounding inflammatory changes.

Spleen: Normal in size without focal abnormality.

Adrenals/Urinary Tract: Adrenal glands are unremarkable. Kidneys are
normal, without renal calculi, focal lesion, or hydronephrosis.
Bladder is unremarkable.

Stomach/Bowel: Stomach is within normal limits. Appendix appears
normal. No evidence of bowel wall thickening, distention, or
inflammatory changes.

Vascular/Lymphatic: No significant vascular findings are present. No
enlarged abdominal or pelvic lymph nodes.

Reproductive: Mildly prominent uterine size, consistent with the
provided history. C-section scar is visible at the anterior uterus.
No hematoma, abscess or other abnormal postoperative collection is
evident. Physiologic appearing ovarian cysts or follicles are
present bilaterally.

Other: No focal inflammation. Fat containing umbilical hernia
incidentally noted.

Musculoskeletal: No significant skeletal lesions.
IMPRESSION: Unremarkable appearances of the uterus, given the history of recent
Cesarean section. No evidence of significant complication. No acute
findings. Small fat containing umbilical hernia incidentally noted.

## 2018-04-23 ENCOUNTER — Ambulatory Visit: Admitting: Certified Nurse Midwife

## 2018-04-24 ENCOUNTER — Ambulatory Visit: Admitting: Obstetrics & Gynecology

## 2018-10-04 ENCOUNTER — Ambulatory Visit: Admitting: Maternal Newborn

## 2018-10-07 ENCOUNTER — Ambulatory Visit (INDEPENDENT_AMBULATORY_CARE_PROVIDER_SITE_OTHER): Admitting: Certified Nurse Midwife

## 2018-10-07 ENCOUNTER — Other Ambulatory Visit (HOSPITAL_COMMUNITY)
Admission: RE | Admit: 2018-10-07 | Discharge: 2018-10-07 | Disposition: A | Discharge status: Home / Self Care | Source: Ambulatory Visit | Attending: Maternal Newborn | Admitting: Maternal Newborn

## 2018-10-07 ENCOUNTER — Encounter: Payer: Self-pay | Admitting: Certified Nurse Midwife

## 2018-10-07 VITALS — BP 110/70 | HR 72 | Ht 64.0 in | Wt 199.0 lb

## 2018-10-07 DIAGNOSIS — R1030 Lower abdominal pain, unspecified: Secondary | ICD-10-CM | POA: Diagnosis present

## 2018-10-07 DIAGNOSIS — F319 Bipolar disorder, unspecified: Secondary | ICD-10-CM | POA: Insufficient documentation

## 2018-10-07 DIAGNOSIS — F3181 Bipolar II disorder: Secondary | ICD-10-CM | POA: Insufficient documentation

## 2018-10-07 DIAGNOSIS — G8929 Other chronic pain: Secondary | ICD-10-CM | POA: Diagnosis not present

## 2018-10-07 DIAGNOSIS — Z3202 Encounter for pregnancy test, result negative: Secondary | ICD-10-CM

## 2018-10-07 DIAGNOSIS — R102 Pelvic and perineal pain: Secondary | ICD-10-CM | POA: Diagnosis not present

## 2018-10-07 DIAGNOSIS — N898 Other specified noninflammatory disorders of vagina: Secondary | ICD-10-CM | POA: Diagnosis not present

## 2018-10-07 DIAGNOSIS — N912 Amenorrhea, unspecified: Secondary | ICD-10-CM

## 2018-10-07 LAB — POCT URINALYSIS DIPSTICK
Bilirubin, UA: NEGATIVE
Blood, UA: NEGATIVE
Glucose, UA: NEGATIVE
Leukocytes, UA: NEGATIVE
Nitrite, UA: NEGATIVE
PROTEIN UA: POSITIVE — AB
Urobilinogen, UA: NEGATIVE E.U./dL — AB
pH, UA: 5 (ref 5.0–8.0)

## 2018-10-07 LAB — POCT WET PREP (WET MOUNT): Trichomonas Wet Prep HPF POC: ABSENT

## 2018-10-07 LAB — POCT URINE PREGNANCY: Preg Test, Ur: NEGATIVE

## 2018-10-07 NOTE — Progress Notes (Signed)
Obstetrics & Gynecology Office Visit   Chief Complaint:  Chief Complaint  Patient presents with  . Gynecologic Exam    pain in ovaries, pelvic area, both breasts for a month    History of Present Illness: 22 year old G1 P1, LMP 09/03/18,  presents with a history of constant  lower abdominal pain x6 months. She also complains of pain in her lower abdomen when she voids since Thursday (10/03/18). No burning when she urinates. No hematuria or frequency. Has also had dyspareunia x few months. She had a fever last week prior to starting Augmentin for an ear infection.Has had loose stools since starting the Augmentin, usually 2/day. Complains of a dark discharge and a vaginal "stench" but no vulvar itching or irritation.  Was seen in February by PA Copland for similar complaints-wet prep was negative An ultrasound was done at that time and was benign:  The uterus is anteverted and measures 9.0 x 5.10 x 4.33cm. Echo texture is homogenous without evidence of focal masses.  The Endometrium measures 4.55 mm.  Right Ovary measures 2.75 x 2.06 x 1.37 cm. It is normal in appearance. - Right fallopian tube is dilated (.64 x .76cm) Left Ovary measures 2.78 x 2.00 x 1.92 cm. It is normal in appearance. Survey of the adnexa demonstrates no adnexal masses. There is no free fluid in the cul de sac.  Impression: 1. Right hydrosalpinx, otherwise normal gyn ultrasound  Recommendations: 1.Clinical correlation with the patient's History and Physical Exam.   Willette Alma, RDMS, RVT  Images reviewed.  The right ovary contains a small thin walled, round, well circumscribed echolucent cyst. 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  Otherwise normal GYN study without and additional visualized pathology.     Vena Austria, MD, FACOG [facility], [facility] 01/04/2018, 6:50 AM   Menses every month lasting 4  days with cramping starting the day before and the first day of her menses. Takes Midol with relief. Menarche age 5. Current contraception: condoms. Had a Cesarean section 11/23/2016 for failure to descend at [facility].  Past Medical History also remarkable for bipolar disorder. She is taking Seroquel and Effexor   Review of Systems:  Review of Systems  Constitutional: Positive for malaise/fatigue. Negative for chills, fever and weight loss.  HENT: Negative for congestion, sinus pain and sore throat.   Eyes: Negative for blurred vision and pain.  Respiratory: Positive for shortness of breath. Negative for hemoptysis and wheezing.   Cardiovascular: Positive for chest pain. Negative for palpitations and leg swelling.  Gastrointestinal: Positive for abdominal pain and diarrhea. Negative for blood in stool, heartburn, nausea and vomiting.  Genitourinary: Negative for dysuria, frequency, hematuria and urgency.       Positive for abdominal pain with urination, dyspareunia, and vaginal discharge with odor.  Musculoskeletal: Positive for joint pain (and swelling). Negative for back pain and myalgias.  Skin: Negative for itching and rash.  Neurological: Positive for dizziness and headaches. Negative for tingling.  Endo/Heme/Allergies: Negative for environmental allergies and polydipsia. Does not bruise/bleed easily.       Negative for hirsutism Positive for hot and cold intolerance  Psychiatric/Behavioral: Negative for depression. The patient is not nervous/anxious and does not have insomnia.     Past Medical History:  Past Medical History:  Diagnosis Date  . Family history of breast cancer    Genetic testing needs to be discussed when pt  is 22  . Mental disorder    Bipolar    Past Surgical History:  Past Surgical History:  Procedure Laterality Date  . CESAREAN SECTION    . TONSILLECTOMY      Gynecologic History: Patient's last menstrual period was  09/03/2018.  Obstetric History: G1P1001  Family History:  Family History  Problem Relation Age of Onset  . Breast cancer Paternal Grandmother   . Breast cancer Maternal Aunt   . Breast cancer Maternal Aunt   . Breast cancer Other   . Breast cancer Other 41    Social History:  Social History   Socioeconomic History  . Marital status: Married    Spouse name: Not on file  . Number of children: Not on file  . Years of education: Not on file  . Highest education level: Not on file  Occupational History  . Occupation: school  Social Needs  . Financial resource strain: Not on file  . Food insecurity:    Worry: Not on file    Inability: Not on file  . Transportation needs:    Medical: Not on file    Non-medical: Not on file  Tobacco Use  . Smoking status: Never Smoker  . Smokeless tobacco: Never Used  Substance and Sexual Activity  . Alcohol use: No  . Drug use: No  . Sexual activity: Yes    Birth control/protection: None, Condom  Lifestyle  . Physical activity:    Days per week: Not on file    Minutes per session: Not on file  . Stress: Not on file  Relationships  . Social connections:    Talks on phone: Not on file    Gets together: Not on file    Attends religious service: Not on file    Active member of club or organization: Not on file    Attends meetings of clubs or organizations: Not on file    Relationship status: Not on file  . Intimate partner violence:    Fear of current or ex partner: Not on file    Emotionally abused: Not on file    Physically abused: Not on file    Forced sexual activity: Not on file  Other Topics Concern  . Not on file  Social History Narrative  . Not on file    Allergies:  No Known Allergies  Medications: Prior to Admission medications   Medication Sig Start Date End Date Taking? Authorizing Provider  amoxicillin-clavulanate (AUGMENTIN) 875-125 MG tablet Take 1 tablet by mouth 2 (two) times daily. 09/24/18  Yes [provider]  QUEtiapine (SEROQUEL) 50 MG tablet Take 1 tablet by mouth daily. 09/07/18  Yes [provider]  venlafaxine XR (EFFEXOR-XR) 75 MG 24 hr capsule Take 3 capsules by mouth daily. 09/09/18  Yes [provider]    Physical Exam Vitals:BP 110/70   Pulse 72   Ht 5\' 4"  (1.626 m)   Wt 199 lb (90.3 kg)   LMP 09/03/2018   BMI 34.16 kg/m     Patient's last menstrual period was 09/03/2018.  Physical Exam  Constitutional: She is oriented to person, place, and time. She appears well-developed and well-nourished. No distress.  Cardiovascular: Normal rate.  Respiratory: Effort normal.  GI: Soft. Bowel sounds are normal. She exhibits no distension and no mass. There is no tenderness. There is no guarding.  Genitourinary:  Genitourinary Comments: Vulva: no lesions, no inflammation Vagina: clear to white discharge, not inflamed Cervix: no lesions, not friable, +/-CMT Uterus: AV,  NSSC, mobile, tender Adnexa: no masses, TTP bilaterally  Musculoskeletal: Normal range of motion.  Neurological: She is alert and oriented to person, place, and time.  Skin: Skin is warm and dry.  Psychiatric:  Flat affect/ normal mood   Results for orders placed or performed in visit on 10/07/18 (from the past 24 hour(s))  POCT urine pregnancy     Status: Normal   Collection Time: 10/07/18 10:35 AM  Result Value Ref Range   Preg Test, Ur Negative Negative  POCT urinalysis dipstick     Status: Abnormal   Collection Time: 10/07/18 10:35 AM  Result Value Ref Range   Color, UA     Clarity, UA     Glucose, UA Negative Negative   Bilirubin, UA NEG    Ketones, UA 2+    Spec Grav, UA >=1.030 (A) 1.010 - 1.025   Blood, UA NEG    pH, UA 5.0 5.0 - 8.0   Protein, UA Positive (A) Negative   Urobilinogen, UA negative (A) 0.2 or 1.0 E.U./dL   Nitrite, UA NEG    Leukocytes, UA Negative Negative   Appearance     Odor    POCT Wet Prep [place])     Status: Normal   Collection Time:  10/07/18  9:14 PM  Result Value Ref Range   Source Wet Prep POC vagina    WBC, Wet Prep HPF POC     Bacteria Wet Prep HPF POC     BACTERIA WET PREP MORPHOLOGY POC     Clue Cells Wet Prep HPF POC None None   Clue Cells Wet Prep Whiff POC     Yeast Wet Prep HPF POC None None   KOH Wet Prep POC     Trichomonas Wet Prep HPF POC Absent Absent    Assessment: 22 y.o. G1P1001 Chronic pelvic pain/ Dyspareunia  R/O interstitial cystitis  R/O endometriosis ` Doubt PID No evidence of vaginitis R/O UTI-is still taking Augmentin for a ear infection Menses is late-negative pregnancy test  Plan: Aptima, urine culture Refer to MD for further evaluation Will call with positive results  Farrel Conners, CNM

## 2018-10-09 LAB — URINE CULTURE: Organism ID, Bacteria: NO GROWTH

## 2018-10-09 LAB — CERVICOVAGINAL ANCILLARY ONLY
BACTERIAL VAGINITIS: NEGATIVE
CHLAMYDIA, DNA PROBE: NEGATIVE
Neisseria Gonorrhea: NEGATIVE

## 2018-10-11 ENCOUNTER — Ambulatory Visit: Admitting: Obstetrics and Gynecology

## 2019-12-22 ENCOUNTER — Ambulatory Visit: Admitting: Family Medicine

## 2020-01-05 ENCOUNTER — Telehealth: Payer: Self-pay

## 2020-01-05 NOTE — Telephone Encounter (Signed)
Mailbox is full and could not leave message

## 2020-01-05 NOTE — Telephone Encounter (Signed)
Copied from CRM [id number]. Topic: Appointment Scheduling - Scheduling Inquiry for Clinic >> Jan 05, 2020  1:03 PM Wyonia Hough E wrote: Reason for CRM: Pt missed her new Pt appt with Dr. Leonard Schwartz and would like to reschedule / please advise

## 2020-01-07 NOTE — Telephone Encounter (Signed)
Left detailed message for patient to call back to schedule new patient appt in April.

## 2020-01-12 ENCOUNTER — Ambulatory Visit: Admitting: Obstetrics and Gynecology

## 2020-07-08 ENCOUNTER — Other Ambulatory Visit: Payer: Self-pay

## 2020-07-08 DIAGNOSIS — R102 Pelvic and perineal pain: Secondary | ICD-10-CM | POA: Insufficient documentation

## 2020-07-08 DIAGNOSIS — F17290 Nicotine dependence, other tobacco product, uncomplicated: Secondary | ICD-10-CM | POA: Diagnosis not present

## 2020-07-08 LAB — COMPREHENSIVE METABOLIC PANEL
ALT: 18 U/L (ref 0–44)
AST: 17 U/L (ref 15–41)
Albumin: 4.5 g/dL (ref 3.5–5.0)
Alkaline Phosphatase: 74 U/L (ref 38–126)
Anion gap: 11 (ref 5–15)
BUN: 14 mg/dL (ref 6–20)
CO2: 25 mmol/L (ref 22–32)
Calcium: 9.5 mg/dL (ref 8.9–10.3)
Chloride: 101 mmol/L (ref 98–111)
Creatinine, Ser: 0.88 mg/dL (ref 0.44–1.00)
GFR calc Af Amer: >60 mL/min (ref >60)
GFR calc non Af Amer: >60 mL/min (ref >60)
Glucose, Bld: 87 mg/dL (ref 70–99)
Potassium: 3.6 mmol/L (ref 3.5–5.1)
Sodium: 137 mmol/L (ref 135–145)
Total Bilirubin: 0.7 mg/dL (ref 0.3–1.2)
Total Protein: 8.1 g/dL (ref 6.5–8.1)

## 2020-07-08 LAB — CBC
HCT: 38.8 % (ref 36.0–46.0)
Hemoglobin: 11.9 g/dL — ABNORMAL LOW (ref 12.0–15.0)
MCH: 24.1 pg — ABNORMAL LOW (ref 26.0–34.0)
MCHC: 30.7 g/dL (ref 30.0–36.0)
MCV: 78.7 fL — ABNORMAL LOW (ref 80.0–100.0)
Platelets: 382 10*3/uL (ref 150–400)
RBC: 4.93 MIL/uL (ref 3.87–5.11)
RDW: 14.6 % (ref 11.5–15.5)
WBC: 9.3 10*3/uL (ref 4.0–10.5)
nRBC: 0 % (ref 0.0–0.2)

## 2020-07-08 LAB — LIPASE, BLOOD: Lipase: 35 U/L (ref 11–51)

## 2020-07-08 NOTE — ED Triage Notes (Signed)
Patient c/o constant lower abdominal pain X 1 year. Patient has been evaluated at [facility] for same, no dx made. Patient reports worsening of pain.

## 2020-07-09 ENCOUNTER — Emergency Department
Admission: EM | Admit: 2020-07-09 | Discharge: 2020-07-09 | Disposition: A | Discharge status: Home / Self Care | Attending: Emergency Medicine | Admitting: Emergency Medicine

## 2020-07-09 ENCOUNTER — Emergency Department

## 2020-07-09 DIAGNOSIS — R102 Pelvic and perineal pain: Secondary | ICD-10-CM

## 2020-07-09 LAB — URINALYSIS, COMPLETE (UACMP) WITH MICROSCOPIC
Bacteria, UA: NONE SEEN
Bilirubin Urine: NEGATIVE
Glucose, UA: NEGATIVE mg/dL
Ketones, ur: NEGATIVE mg/dL
Leukocytes,Ua: NEGATIVE
Nitrite: NEGATIVE
Protein, ur: NEGATIVE mg/dL
Specific Gravity, Urine: 1.02 (ref 1.005–1.030)
pH: 6 (ref 5.0–8.0)

## 2020-07-09 LAB — WET PREP, GENITAL
Clue Cells Wet Prep HPF POC: NONE SEEN
Sperm: NONE SEEN
Trich, Wet Prep: NONE SEEN
Yeast Wet Prep HPF POC: NONE SEEN

## 2020-07-09 LAB — CHLAMYDIA/NGC RT PCR (ARMC ONLY)
Chlamydia Tr: NOT DETECTED
N gonorrhoeae: NOT DETECTED

## 2020-07-09 LAB — POCT PREGNANCY, URINE: Preg Test, Ur: NEGATIVE

## 2020-07-09 MED ORDER — TRAMADOL HCL 50 MG PO TABS: 50.0000 mg | ORAL_TABLET | Freq: Four times a day (QID) | ORAL | 0 refills | Status: AC | PRN

## 2020-07-09 NOTE — ED Provider Notes (Signed)
Ultrasound is reassuring. Patient resting comfortably. Repeat abdominal exam benign. Appropriate for outpatient follow-up.   Willy Eddy, MD 07/09/20 [phone]

## 2020-07-09 NOTE — ED Provider Notes (Signed)
[facility] Emergency Department Provider Note  Time seen: 4:50 AM  I have reviewed the triage vital signs and the nursing notes.   HISTORY  Chief Complaint Abdominal Pain   HPI Chelsea Vargas is a 24 y.o. female with a past medical history of bipolar presents to the emergency department for lower abdominal/pelvic discomfort.  According to the patient for the past year to year and a half she has been experiencing lower abdominal/pelvic pain which she describes as pain in her "cervix."  Patient states over the past several days it has worsened to the point where she gets nauseated due to the pain and will occasionally vomit.  Patient states she is seeing her OB/GYN for the same in the past and has not gotten a diagnosis.  Patient is currently on her menstrual cycle.  Denies any fever.  Largely negative review of systems otherwise.  Denies vaginal discharge.  No dysuria.   Past Medical History:  Diagnosis Date  . Family history of breast cancer    Genetic testing needs to be discussed when pt is 51  . Mental disorder    Bipolar    Patient Active Problem List   Diagnosis Date Noted  . Bipolar disorder (HCC) 10/07/2018  . Hydrosalpinx 01/03/2018    Past Surgical History:  Procedure Laterality Date  . CESAREAN SECTION    . TONSILLECTOMY      Prior to Admission medications   Medication Sig Start Date End Date Taking? Authorizing Provider  amoxicillin-clavulanate (AUGMENTIN) 875-125 MG tablet Take 1 tablet by mouth 2 (two) times daily. 09/24/18   [provider]  QUEtiapine (SEROQUEL) 50 MG tablet Take 1 tablet by mouth daily. 09/07/18   [provider]  venlafaxine XR (EFFEXOR-XR) 75 MG 24 hr capsule Take 3 capsules by mouth daily. 09/09/18   [provider]    No Known Allergies  Family History  Problem Relation Age of Onset  . Breast cancer Paternal Grandmother   . Breast cancer Maternal Aunt   . Breast cancer Maternal  Aunt   . Breast cancer Other   . Breast cancer Other 57    Social History Social History   Tobacco Use  . Smoking status: Current Every Day Smoker    Types: E-cigarettes  . Smokeless tobacco: Never Used  Vaping Use  . Vaping Use: Never used  Substance Use Topics  . Alcohol use: No  . Drug use: No    Review of Systems Constitutional: Negative for fever. Cardiovascular: Negative for chest pain. Respiratory: Negative for shortness of breath. Gastrointestinal: Lower abdominal/pelvic discomfort.  Positive for nausea vomiting. Genitourinary: Negative for urinary compaints.  Positive for vaginal bleeding. Musculoskeletal: Negative for musculoskeletal complaints Neurological: Negative for headache All other ROS negative  ____________________________________________   PHYSICAL EXAM:  VITAL SIGNS: ED Triage Vitals [07/08/20 2222]  Enc Vitals Group     BP 140/88     Pulse Rate 79     Resp 17     Temp 99.3 F (37.4 C)     Temp src      SpO2 100 %     Weight 175 lb (79.4 kg)     Height 5\' 4"  (1.626 m)     Head Circumference      Peak Flow      Pain Score 8     Pain Loc      Pain Edu?      Excl. in GC?    Constitutional: Alert and oriented.  Well appearing and in no distress. Eyes: Normal exam ENT      Head: Normocephalic and atraumatic.      Mouth/Throat: Mucous membranes are moist. Cardiovascular: Normal rate, regular rhythm.  Respiratory: Normal respiratory effort without tachypnea nor retractions. Breath sounds are clear  Gastrointestinal: Soft and nontender. No distention.   Musculoskeletal: Nontender with normal range of motion in all extremities.  Neurologic:  Normal speech and language. No gross focal neurologic deficits  Skin:  Skin is warm, dry and intact.  Psychiatric: Mood and affect are normal.  ____________________________________________   RADIOLOGY  Normal ultrasound  ____________________________________________   INITIAL IMPRESSION /  ASSESSMENT AND PLAN / ED COURSE  Pertinent labs & imaging results that were available during my care of the patient were reviewed by me and considered in my medical decision making (see chart for details).   Patient presents emergency department for lower abdominal/pelvic discomfort ongoing for the past 1.5 years but worse over the past 2 days now with intermittent nausea vomiting due to the discomfort.  Patient is currently on her menstrual cycle, which could indicate possible endometriosis as the cause of her discomfort.  Patient's lab work is overall reassuring.  Denies any vaginal discharge.  We will perform a pelvic examination, send swabs and obtain a pelvic ultrasound to further evaluate.  Patient agreeable to plan of care.  Normal ultrasound.  Normal wet prep.  Patient will be discharged home with a short course of tramadol and OB/GYN follow-up.  Chelsea Vargas was evaluated in Emergency Department on 07/09/2020 for the symptoms described in the history of present illness. She was evaluated in the context of the global COVID-19 pandemic, which necessitated consideration that the patient might be at risk for infection with the SARS-CoV-2 virus that causes COVID-19. Institutional protocols and algorithms that pertain to the evaluation of patients at risk for COVID-19 are in a state of rapid change based on information released by regulatory bodies including the CDC and federal and state organizations. These policies and algorithms were followed during the patient's care in the ED.  ____________________________________________   FINAL CLINICAL IMPRESSION(S) / ED DIAGNOSES  Pelvic pain   Minna Antis, MD 07/10/20 [phone]

## 2020-07-21 ENCOUNTER — Ambulatory Visit: Admitting: Dermatology

## 2020-11-25 ENCOUNTER — Other Ambulatory Visit: Payer: Self-pay

## 2020-11-25 ENCOUNTER — Ambulatory Visit
Admission: RE | Admit: 2020-11-25 | Discharge: 2020-11-25 | Disposition: A | Discharge status: Home / Self Care | Source: Ambulatory Visit | Attending: Physician Assistant | Admitting: Physician Assistant

## 2020-11-25 VITALS — BP 116/67 | HR 98 | Temp 99.6°F | Resp 18 | Ht 64.0 in | Wt 170.0 lb

## 2020-11-25 DIAGNOSIS — J069 Acute upper respiratory infection, unspecified: Secondary | ICD-10-CM | POA: Diagnosis not present

## 2020-11-25 DIAGNOSIS — U071 COVID-19: Secondary | ICD-10-CM | POA: Diagnosis not present

## 2020-11-25 DIAGNOSIS — R0981 Nasal congestion: Secondary | ICD-10-CM

## 2020-11-25 HISTORY — DX: Bipolar disorder, unspecified: F31.9

## 2020-11-25 HISTORY — DX: Depression, unspecified: F32.A

## 2020-11-25 HISTORY — DX: Anxiety disorder, unspecified: F41.9

## 2020-11-25 LAB — RESP PANEL BY RT-PCR (FLU A&B, COVID) ARPGX2
Influenza A by PCR: NEGATIVE
Influenza B by PCR: NEGATIVE
SARS Coronavirus 2 by RT PCR: POSITIVE — AB

## 2020-11-25 NOTE — Discharge Instructions (Signed)

## 2020-11-25 NOTE — ED Triage Notes (Signed)
Patient in today c/o nasal congestion and body aches. Patient denies cough or fever. Patient has taken OTC Tylenol. Patient has not had the covid vaccines.

## 2020-11-25 NOTE — ED Provider Notes (Signed)
[facility]    CSN: [id number] Arrival date & time: 11/25/20  1654      History   Chief Complaint Chief Complaint  Patient presents with  . Appointment  . Generalized Body Aches  . Nasal Congestion    HPI Chelsea Vargas is a 24 y.o. female presenting for onset of nasal congestion and body aches 3 days ago. Patient denies any known COVID-19 exposure and has not received Covid vaccines.  Has taken over-the-counter Tylenol. Patient denies any other associated symptoms. She denies any fever, fatigue, cough, sore throat, headaches, chest discomfort, breathing difficulty, abdominal pain, N/V/D, or changes in smell and taste. Denies any history of cardiopulmonary disease. She has no other complaints or concerns today.  HPI  Past Medical History:  Diagnosis Date  . Acne    iPledge #: [id number]  . Anxiety   . Bipolar disorder (HCC)   . Depression   . Family history of breast cancer    Genetic testing needs to be discussed when pt is 43  . Mental disorder    Bipolar    Patient Active Problem List   Diagnosis Date Noted  . Bipolar disorder (HCC) 10/07/2018  . Hydrosalpinx 01/03/2018    Past Surgical History:  Procedure Laterality Date  . CESAREAN SECTION    . TONSILLECTOMY      OB History    Gravida  1   Para  1   Term  1   Preterm      AB      Living  1     SAB      IAB      Ectopic      Multiple      Live Births  1            Home Medications    Prior to Admission medications   Medication Sig Start Date End Date Taking? Authorizing Provider  QUEtiapine (SEROQUEL) 50 MG tablet Take 1 tablet by mouth daily. 09/07/18  Yes [provider]  venlafaxine XR (EFFEXOR-XR) 75 MG 24 hr capsule Take 3 capsules by mouth daily. 09/09/18  Yes [provider]  traMADol (ULTRAM) 50 MG tablet Take 1 tablet (50 mg total) by mouth every 6 (six) hours as needed. 07/09/20 07/09/21  Minna Antis, MD    Family History Family  History  Problem Relation Age of Onset  . Breast cancer Paternal Grandmother   . Breast cancer Maternal Aunt   . Breast cancer Maternal Aunt   . Breast cancer Other   . Breast cancer Other 20  . Rheum arthritis Mother   . Cirrhosis Mother        non alcoholic  . Diabetes Mother   . Cancer Father     Social History Social History   Tobacco Use  . Smoking status: Never Smoker  . Smokeless tobacco: Never Used  Vaping Use  . Vaping Use: Every day  . Substances: Nicotine  Substance Use Topics  . Alcohol use: Yes    Comment: occassional  . Drug use: No     Allergies   Patient has no known allergies.   Review of Systems Review of Systems  Constitutional: Negative for chills, diaphoresis, fatigue and fever.  HENT: Positive for congestion. Negative for ear pain, rhinorrhea, sinus pressure, sinus pain and sore throat.   Respiratory: Negative for cough and shortness of breath.   Gastrointestinal: Negative for abdominal pain, nausea and vomiting.  Musculoskeletal: Positive for myalgias. Negative for arthralgias.  Skin: Negative for rash.  Neurological: Negative for weakness and headaches.  Hematological: Negative for adenopathy.     Physical Exam Triage Vital Signs ED Triage Vitals  Enc Vitals Group     BP 11/25/20 1721 116/67     Pulse Rate 11/25/20 1721 98     Resp 11/25/20 1721 18     Temp 11/25/20 1721 99.6 F (37.6 C)     Temp Source 11/25/20 1721 Oral     SpO2 11/25/20 1721 100 %     Weight 11/25/20 1719 170 lb (77.1 kg)     Height 11/25/20 1719 5\' 4"  (1.626 m)     Head Circumference --      Peak Flow --      Pain Score 11/25/20 1719 8     Pain Loc --      Pain Edu? --      Excl. in GC? --    No data found.  Updated Vital Signs BP 116/67 (BP Location: Left Arm)   Pulse 98   Temp 99.6 F (37.6 C) (Oral)   Resp 18   Ht 5\' 4"  (1.626 m)   Wt 170 lb (77.1 kg)   LMP 11/11/2020 (Approximate)   SpO2 100%   BMI 29.18 kg/m       Physical  Exam Vitals and nursing note reviewed.  Constitutional:      General: She is not in acute distress.    Appearance: Normal appearance. She is not ill-appearing or toxic-appearing.  HENT:     Head: Normocephalic and atraumatic.     Nose: Congestion and rhinorrhea (trace clear drainage) present.     Mouth/Throat:     Mouth: Mucous membranes are moist.     Pharynx: Oropharynx is clear.  Eyes:     General: No scleral icterus.       Right eye: No discharge.        Left eye: No discharge.     Conjunctiva/sclera: Conjunctivae normal.  Cardiovascular:     Rate and Rhythm: Normal rate and regular rhythm.     Heart sounds: Normal heart sounds.  Pulmonary:     Effort: Pulmonary effort is normal. No respiratory distress.     Breath sounds: Normal breath sounds.  Musculoskeletal:     Cervical back: Neck supple.  Skin:    General: Skin is dry.  Neurological:     General: No focal deficit present.     Mental Status: She is alert. Mental status is at baseline.     Motor: No weakness.     Gait: Gait normal.  Psychiatric:        Mood and Affect: Mood normal.        Behavior: Behavior normal.        Thought Content: Thought content normal.      UC Treatments / Results  Labs (all labs ordered are listed, but only abnormal results are displayed) Labs Reviewed  RESP PANEL BY RT-PCR (FLU A&B, COVID) ARPGX2 - Abnormal; Notable for the following components:      Result Value   SARS Coronavirus 2 by RT PCR POSITIVE (*)    All other components within normal limits    EKG   Radiology No results found.  Procedures Procedures (including critical care time)  Medications Ordered in UC Medications - No data to display  Initial Impression / Assessment and Plan / UC Course  I have reviewed the triage vital signs and the nursing notes.  Pertinent labs & imaging  results that were available during my care of the patient were reviewed by me and considered in my medical decision making (see  chart for details).   24 year old female with complaints of congestion and body aches. All vital signs within normal limits. She is well-appearing and in no acute distress.  Respiratory panel obtained and positive for COVID-19. CDC guidelines, isolation protocol, and ED precautions reviewed with patient. Supportive care advised with increasing rest and fluids. Advised decongestants and nasal sprays. She is 24 year old female without any significant medical problems and mild symptoms. Advised her she should be suitable for at home care, but reviewed ED precautions again with patient.   Final Clinical Impressions(s) / UC Diagnoses   Final diagnoses:  Viral upper respiratory tract infection  Nasal congestion  COVID-19     Discharge Instructions     You have received COVID testing today either for positive exposure, concerning symptoms that could be related to COVID infection, screening purposes, or re-testing after confirmed positive.  Your test obtained today checks for active viral infection in the last 1-2 weeks. If your test is negative now, you can still test positive later. So, if you do develop symptoms you should either get re-tested and/or isolate x 10 days. Please follow CDC guidelines.  While Rapid antigen tests come back in 15-20 minutes, send out PCR/molecular test results typically come back within 24 hours. In the mean time, if you are symptomatic, assume this could be a positive test and treat/monitor yourself as if you do have COVID.   We will call with test results. Please download the MyChart app and set up a profile to access test results.   If symptomatic, go home and rest. Push fluids. Take Tylenol as needed for discomfort. Gargle warm salt water. Throat lozenges. Take Mucinex DM or Robitussin for cough. Humidifier in bedroom to ease coughing. Warm showers. Also review the COVID handout for more information.  COVID-19 INFECTION: The incubation period of COVID-19 is  approximately 14 days after exposure, with most symptoms developing in roughly 4-5 days. Symptoms may range in severity from mild to critically severe. Roughly 80% of those infected will have mild symptoms. People of any age may become infected with COVID-19 and have the ability to transmit the virus. The most common symptoms include: fever, fatigue, cough, body aches, headaches, sore throat, nasal congestion, shortness of breath, nausea, vomiting, diarrhea, changes in smell and/or taste.    COURSE OF ILLNESS Some patients may begin with mild disease which can progress quickly into critical symptoms. If your symptoms are worsening please call ahead to the Emergency Department and proceed there for further treatment. Recovery time appears to be roughly 1-2 weeks for mild symptoms and 3-6 weeks for severe disease.   GO IMMEDIATELY TO ER FOR FEVER YOU ARE UNABLE TO GET DOWN WITH TYLENOL, BREATHING PROBLEMS, CHEST PAIN, FATIGUE, LETHARGY, INABILITY TO EAT OR DRINK, ETC  QUARANTINE AND ISOLATION: To help decrease the spread of COVID-19 please remain isolated if you have COVID infection or are highly suspected to have COVID infection. This means -stay home and isolate to one room in the home if you live with others. Do not share a bed or bathroom with others while ill, sanitize and wipe down all countertops and keep common areas clean and disinfected. You may discontinue isolation if you have a mild case and are asymptomatic 10 days after symptom onset as long as you have been fever free >24 hours without having to take Motrin or Tylenol.  If your case is more severe (meaning you develop pneumonia or are admitted in the hospital), you may have to isolate longer.   If you have been in close contact (within 6 feet) of someone diagnosed with COVID 19, you are advised to quarantine in your home for 14 days as symptoms can develop anywhere from 2-14 days after exposure to the virus. If you develop symptoms, you  must  isolate.  Most current guidelines for COVID after exposure -isolate 10 days if you ARE NOT tested for COVID as long as symptoms do not develop -isolate 7 days if you are tested and remain asymptomatic -You do not necessarily need to be tested for COVID if you have + exposure and        develop   symptoms. Just isolate at home x10 days from symptom onset During this global pandemic, CDC advises to practice social distancing, try to stay at least 50ft away from others at all times. Wear a face covering. Wash and sanitize your hands regularly and avoid going anywhere that is not necessary.  KEEP IN MIND THAT THE COVID TEST IS NOT 100% ACCURATE AND YOU SHOULD STILL DO EVERYTHING TO PREVENT POTENTIAL SPREAD OF VIRUS TO OTHERS (WEAR MASK, WEAR GLOVES, WASH HANDS AND SANITIZE REGULARLY). IF INITIAL TEST IS NEGATIVE, THIS MAY NOT MEAN YOU ARE DEFINITELY NEGATIVE. MOST ACCURATE TESTING IS DONE 5-7 DAYS AFTER EXPOSURE.   It is not advised by CDC to get re-tested after receiving a positive COVID test since you can still test positive for weeks to months after you have already cleared the virus.   *If you have not been vaccinated for COVID, I strongly suggest you consider getting vaccinated as long as there are no contraindications.      ED Prescriptions    None     PDMP not reviewed this encounter.   Shirlee Latch, PA-C 11/27/20 1228

## 2021-03-29 ENCOUNTER — Ambulatory Visit (INDEPENDENT_AMBULATORY_CARE_PROVIDER_SITE_OTHER): Admitting: Obstetrics and Gynecology

## 2021-03-29 ENCOUNTER — Encounter: Payer: Self-pay | Admitting: Obstetrics and Gynecology

## 2021-03-29 ENCOUNTER — Other Ambulatory Visit (HOSPITAL_COMMUNITY)
Admission: RE | Admit: 2021-03-29 | Discharge: 2021-03-29 | Disposition: A | Discharge status: Home / Self Care | Source: Ambulatory Visit | Attending: Obstetrics and Gynecology | Admitting: Obstetrics and Gynecology

## 2021-03-29 ENCOUNTER — Other Ambulatory Visit: Payer: Self-pay

## 2021-03-29 VITALS — BP 122/78 | HR 80 | Wt 206.0 lb

## 2021-03-29 DIAGNOSIS — Z3481 Encounter for supervision of other normal pregnancy, first trimester: Secondary | ICD-10-CM

## 2021-03-29 DIAGNOSIS — N912 Amenorrhea, unspecified: Secondary | ICD-10-CM

## 2021-03-29 DIAGNOSIS — Z124 Encounter for screening for malignant neoplasm of cervix: Secondary | ICD-10-CM | POA: Insufficient documentation

## 2021-03-29 DIAGNOSIS — Z113 Encounter for screening for infections with a predominantly sexual mode of transmission: Secondary | ICD-10-CM | POA: Insufficient documentation

## 2021-03-29 DIAGNOSIS — Z130 Encounter for screening for diseases of the blood and blood-forming organs and certain disorders involving the immune mechanism: Secondary | ICD-10-CM

## 2021-03-29 DIAGNOSIS — Z3490 Encounter for supervision of normal pregnancy, unspecified, unspecified trimester: Secondary | ICD-10-CM | POA: Insufficient documentation

## 2021-03-29 DIAGNOSIS — Z3149 Encounter for other procreative investigation and testing: Secondary | ICD-10-CM

## 2021-03-29 DIAGNOSIS — Z7185 Encounter for immunization safety counseling: Secondary | ICD-10-CM

## 2021-03-29 DIAGNOSIS — Z6835 Body mass index (BMI) 35.0-35.9, adult: Secondary | ICD-10-CM

## 2021-03-29 DIAGNOSIS — O281 Abnormal biochemical finding on antenatal screening of mother: Secondary | ICD-10-CM

## 2021-03-29 DIAGNOSIS — F1290 Cannabis use, unspecified, uncomplicated: Secondary | ICD-10-CM | POA: Insufficient documentation

## 2021-03-29 LAB — POCT URINALYSIS DIPSTICK OB
Appearance: NORMAL
Bilirubin, UA: NEGATIVE
Blood, UA: NEGATIVE
Glucose, UA: NEGATIVE
Ketones, UA: NEGATIVE
Leukocytes, UA: NEGATIVE
Nitrite, UA: NEGATIVE
Odor: NORMAL
POC,PROTEIN,UA: NEGATIVE
Spec Grav, UA: 1.01 (ref 1.010–1.025)
Urobilinogen, UA: 0.2 E.U./dL
pH, UA: 5 (ref 5.0–8.0)

## 2021-03-29 LAB — POCT URINE PREGNANCY: Preg Test, Ur: POSITIVE — AB

## 2021-03-29 NOTE — Progress Notes (Signed)
New Obstetric Patient H&P    Chief Complaint: Missed period, +UPT yesterday   History of Present Illness: Patient is a 25 y.o. G2P1001 Not Hispanic or Latino female, presents with amenorrhea and positive home pregnancy test. Patient's last menstrual period was 02/08/2021 (approximate). and based on her  LMP, her EDD is Estimated Date of Delivery: 11/15/21 and her EGA is [redacted]w[redacted]d. Cycles are 3-4 days, regular, and occur every 28 days. She has never had a pap smear.  She had a urine pregnancy test which was positive 1 day(s)  ago. Her last menstrual period was normal and lasted for  3 or 4 day(s). Since her LMP she claims she has experienced increased breast tenderness. She denies vaginal bleeding. Her past medical history is significant for a hx of bipolar and anxiety. Her prior pregnancies are notable for primary cesarean delivery due to failure to progress, infant was 7lb 6oz. Patient reports being told her "hips were too small."  Since her LMP, she admits to the use of tobacco products  Yes - daily vaping She claims she has gained   no pounds since the start of her pregnancy.  There are cats in the home in the home  yes If yes Indoor, stepfather changes litterbox She admits close contact with children on a regular basis  yes  She has had chicken pox in the past no She has had Tuberculosis exposures, symptoms, or previously tested positive for TB   no Current or past history of domestic violence. no  Genetic Screening/Teratology Counseling: (Includes patient, baby's father, or anyone in either family with:)   1. Patient's age >/= 21 [facility]  no 2. Thalassemia ([place], [place], Mediterranean, or Asian background): MCV<80  no 3. Neural tube defect (meningomyelocele, spina bifida, anencephaly)  no 4. Congenital heart defect  no  5. Down syndrome  no 6. Tay-Sachs (Jewish, [place]  no 7. Canavan's Disease  no 8. Sickle cell disease or trait (African)  no  9. Hemophilia or other blood  disorders  no  10. Muscular dystrophy  no  11. Cystic fibrosis  no  12. Huntington's Chorea  no  13. Mental retardation/autism  no 14. Other inherited genetic or chromosomal disorder  no 15. Maternal metabolic disorder (DM, PKU, etc)  no 16. Patient or FOB with a child with a birth defect not listed above no  16a. Patient or FOB with a birth defect themselves no 17. Recurrent pregnancy loss, or stillbirth  no  18. Any medications since LMP other than prenatal vitamins (include vitamins, supplements, OTC meds, drugs, alcohol)  no 19. Any other genetic/environmental exposure to discuss  no  Infection History:   1. Lives with someone with TB or TB exposed  no  2. Patient or partner has history of genital herpes  no 3. Rash or viral illness since LMP  no 4. History of STI (GC, CT, HPV, syphilis, HIV)  no 5. History of recent travel :  no  Other pertinent information:  Works at [employer]. Lives with 54 year old son, step father, and boyfriend "Malek."   Review of Systems:10 point review of systems negative unless otherwise noted in HPI  Past Medical History:  Patient Active Problem List   Diagnosis Date Noted  . Supervision of normal pregnancy 03/29/2021     Nursing Staff Provider  Office Location  [facility] Dating   LMP  Language  English Anatomy US    Flu Vaccine   declined Genetic Screen  NIPS:  desires  TDaP vaccine    Hgb A1C or  GTT Early : ordered Third trimester :   Rhogam     LAB RESULTS   Feeding Plan  unsure Blood Type     Contraception  Antibody    Circumcision  Rubella    Pediatrician   RPR     Support Person  Malek HBsAg     Prenatal Classes  HIV      Varicella @[email or web address] @   BTL Consent  n/a GBS  (For PCN allergy, check sensitivities)        VBAC Consent  hx of cesarean x1 - needs TOLAC counseling Pap  collected 03/29/21    Hgb Electro      CF      SMA            . Marijuana use 03/29/2021  . BMI 35.0-35.9,adult 03/29/2021  . Bipolar  disorder (HCC) 10/07/2018  . 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 CESAREAN SECTION    . TONSILLECTOMY      Gynecologic History: Patient's last menstrual period was 02/08/2021 (approximate).  Obstetric History: G2P1001  Family History:  Family History  Problem Relation Age of Onset  . Breast cancer Paternal Grandmother   . Breast cancer Maternal Aunt   . Breast cancer Maternal Aunt   . Breast cancer Other   . Breast cancer Other 20  . Rheum arthritis Mother   . Cirrhosis Mother        non alcoholic  . Diabetes Mother   . Cancer Father     Social History:  Social History   Socioeconomic History  . Marital status: Married    Spouse name: Not on file  . Number of children: Not on file  . Years of education: Not on file  . Highest education level: Not on file  Occupational History  . Occupation: school  Tobacco Use  . Smoking status: Never Smoker  . Smokeless tobacco: Never Used  Vaping Use  . Vaping Use: Every day  . Substances: Nicotine  Substance and Sexual Activity  . Alcohol use: Yes    Comment: occassional  . Drug use: No  . Sexual activity: Yes    Birth control/protection: None, Condom  Other Topics Concern  . Not on file  Social History Narrative  . Not on file   Social Determinants of Health   Financial Resource Strain: Not on file  Food Insecurity: Not on file  Transportation Needs: Not on file  Physical Activity: Not on file  Stress: Not on file  Social Connections: Not on file  Intimate Partner Violence: Not on file    Allergies:  No Known Allergies  Medications: Prior to Admission medications   Medication Sig Start Date End Date Taking? Authorizing Provider  QUEtiapine (SEROQUEL) 50 MG tablet Take 1 tablet by mouth daily. 09/07/18  Yes [provider]  venlafaxine XR (EFFEXOR-XR) 75 MG 24 hr capsule Take 3 capsules by mouth  daily. 09/09/18  Yes [provider]  traMADol (ULTRAM) 50 MG tablet Take 1 tablet (50 mg total) by mouth every 6 (six) hours as needed. Patient not taking: Reported on 03/29/2021 07/09/20 07/09/21  07/11/21, MD    Physical Exam Vitals: Blood pressure 122/78, pulse 80, weight 206 lb (93.4 kg), last menstrual period 02/08/2021.  General: NAD HEENT: normocephalic, anicteric Thyroid: no enlargement, no palpable nodules Pulmonary: No increased  work of breathing, CTAB Cardiovascular: RRR, distal pulses 2+ Abdomen: NABS, soft, non-tender, non-distended.  Umbilicus without lesions.  No hepatomegaly, splenomegaly or masses palpable. No evidence of hernia  Genitourinary:  External: Normal external female genitalia.  Normal urethral meatus, normal  Bartholin's and Skene's glands.    Vagina: Normal vaginal mucosa, no evidence of prolapse.    Cervix: Grossly normal in appearance, no bleeding  Bimanual deferred   Rectal: deferred Extremities: no edema, erythema, or tenderness Neurologic: Grossly intact Psychiatric: mood appropriate, affect full   Assessment: 25 y.o. G2P1001 at [redacted]w[redacted]d presenting to initiate prenatal care  Plan: 1) Avoid alcoholic beverages. 2) Patient encouraged not to smoke. Daily vaping. 3) Discontinue the use of all non-medicinal drugs and chemicals. Discouraged THC use in pregnancy. 4) Take prenatal vitamins daily.  5) Nutrition, food safety (fish, cheese advisories, and high nitrite foods) and exercise discussed. 6) Hospital and practice style discussed with cross coverage system.  7) Genetic Screening, such as with 1st Trimester Screening, cell free fetal DNA, AFP testing, and Ultrasound, as well as with amniocentesis and CVS as appropriate, is discussed with patient. At the conclusion of today's visit patient requested genetic testing - Inheritest today, NIPS after 10 weeks  8)NOB labs/Inheritest/sickle cell screen/Pap/GC/CT/urine collected today  9) Will  RTC in 3 weeks for 1h GTT/dating scan with MD/NIPS   Zipporah Plants, CNM, MSN [facility], [facility] 03/29/2021, 2:55 PM

## 2021-03-31 LAB — CYTOLOGY - PAP
Chlamydia: NEGATIVE
Comment: NEGATIVE
Comment: NORMAL
Diagnosis: NEGATIVE
Neisseria Gonorrhea: NEGATIVE

## 2021-03-31 LAB — RPR+RH+ABO+RUB AB+AB SCR+CB...
Antibody Screen: NEGATIVE
HIV Screen 4th Generation wRfx: NONREACTIVE
Hematocrit: 37.7 % (ref 34.0–46.6)
Hemoglobin: 12 g/dL (ref 11.1–15.9)
Hepatitis B Surface Ag: NEGATIVE
MCH: 24.9 pg — ABNORMAL LOW (ref 26.6–33.0)
MCHC: 31.8 g/dL (ref 31.5–35.7)
MCV: 78 fL — ABNORMAL LOW (ref 79–97)
Platelets: 351 10*3/uL (ref 150–450)
RBC: 4.81 x10E6/uL (ref 3.77–5.28)
RDW: 14.2 % (ref 11.7–15.4)
RPR Ser Ql: NONREACTIVE
Rh Factor: NEGATIVE
Rubella Antibodies, IGG: 11.5 index (ref >0.99)
Varicella zoster IgG: <135 index — ABNORMAL LOW (ref >165)
WBC: 8.5 10*3/uL (ref 3.4–10.8)

## 2021-03-31 LAB — URINE CULTURE

## 2021-03-31 LAB — HGB FRACTIONATION CASCADE
Hgb A2: 2.3 % (ref 1.8–3.2)
Hgb A: 97.7 % (ref 96.4–98.8)
Hgb F: 0 % (ref 0.0–2.0)
Hgb S: 0 %

## 2021-04-07 LAB — INHERITEST CORE(CF97,SMA,FRAX)

## 2021-04-14 ENCOUNTER — Encounter: Payer: Self-pay | Admitting: Obstetrics and Gynecology

## 2021-04-14 LAB — URINE DRUG PANEL 7
Amphetamines, Urine: NEGATIVE ng/mL
Barbiturate Quant, Ur: NEGATIVE ng/mL
Benzodiazepine Quant, Ur: NEGATIVE ng/mL
Cannabinoid Quant, Ur: POSITIVE — AB
Cocaine (Metab.): NEGATIVE ng/mL
Opiate Quant, Ur: NEGATIVE ng/mL
PCP Quant, Ur: NEGATIVE ng/mL

## 2021-04-19 ENCOUNTER — Encounter: Admitting: Obstetrics and Gynecology

## 2021-04-19 ENCOUNTER — Other Ambulatory Visit

## 2021-05-23 ENCOUNTER — Encounter: Payer: Self-pay | Admitting: Obstetrics & Gynecology

## 2021-05-23 ENCOUNTER — Ambulatory Visit (INDEPENDENT_AMBULATORY_CARE_PROVIDER_SITE_OTHER): Admitting: Obstetrics & Gynecology

## 2021-05-23 ENCOUNTER — Other Ambulatory Visit: Payer: Self-pay

## 2021-05-23 VITALS — BP 120/80 | Wt 208.0 lb

## 2021-05-23 DIAGNOSIS — Z6791 Unspecified blood type, Rh negative: Secondary | ICD-10-CM

## 2021-05-23 DIAGNOSIS — O26891 Other specified pregnancy related conditions, first trimester: Secondary | ICD-10-CM

## 2021-05-23 DIAGNOSIS — O209 Hemorrhage in early pregnancy, unspecified: Secondary | ICD-10-CM

## 2021-05-23 MED ORDER — RHO D IMMUNE GLOBULIN 1500 UNIT/2ML IJ SOSY
300.0000 ug | PREFILLED_SYRINGE | Freq: Once | INTRAMUSCULAR | Status: AC
  Administered 2021-05-23: 10:00:00 300 ug via INTRAMUSCULAR

## 2021-05-23 NOTE — Progress Notes (Signed)
Obstetric Problem Visit   Chief Complaint: First trimester bleeding  History of Present Illness: Patient is a 25 y.o. [id number] presenting for first trimester bleeding.  The onset of bleeding was Friday, preceded by pos UCG Thurs.  This is after a early pregnancy loss in May 2022, no period since that time but she did have neg UCG in between that episode and now.  Denies nausea or breast T.  Mild lower abd cramping.  Is bleeding equal to or greater than normal menstrual flow:  No Any recent trauma:  No Recent intercourse:  No History of prior miscarriage:  Yes Prior ultrasound demonstrating IUP:  No Prior ultrasound demonstrating viable IUP:  No Prior Serum HCG:  No Rh status: O-  PMHx: She  has a past medical history of Acne, Anxiety, Bipolar disorder (HCC), Depression, Family history of breast cancer, and Mental disorder. Also,  has a past surgical history that includes Tonsillectomy and Cesarean section., family history includes Breast cancer in her maternal aunt, maternal aunt, paternal grandmother, and another family member; Breast cancer (age of onset: 74) in an other family member; Cancer in her father; Cirrhosis in her mother; Diabetes in her mother; Rheum arthritis in her mother.,  reports that she has never smoked. She has never used smokeless tobacco. She reports current alcohol use. She reports that she does not use drugs.  She has a current medication list which includes the following prescription(s): quetiapine, tramadol, and venlafaxine xr. Also, has No Known Allergies.  Review of Systems  Constitutional:  Negative for chills, fever and malaise/fatigue.  HENT:  Negative for congestion, sinus pain and sore throat.   Eyes:  Negative for blurred vision and pain.  Respiratory:  Negative for cough and wheezing.   Cardiovascular:  Negative for chest pain and leg swelling.  Gastrointestinal:  Negative for abdominal pain, constipation, diarrhea, heartburn, nausea and vomiting.   Genitourinary:  Negative for dysuria, frequency, hematuria and urgency.  Musculoskeletal:  Negative for back pain, joint pain, myalgias and neck pain.  Skin:  Negative for itching and rash.  Neurological:  Negative for dizziness, tremors and weakness.  Endo/Heme/Allergies:  Does not bruise/bleed easily.  Psychiatric/Behavioral:  Negative for depression. The patient is not nervous/anxious and does not have insomnia.    Objective: Vitals:   05/23/21 0908  BP: 120/80   Physical Exam Constitutional:      General: She is not in acute distress.    Appearance: She is well-developed.  Musculoskeletal:        General: Normal range of motion.  Neurological:     Mental Status: She is alert and oriented to person, place, and time.  Skin:    General: Skin is warm and dry.  Vitals reviewed.    Assessment: 25 y.o. G2P1001 [redacted]w[redacted]d 1. First trimester bleeding - Beta hCG quant (ref lab); Future - ABO AND RH  - Beta hCG quant (ref lab)   Plan: Problem List Items Addressed This Visit     First trimester bleeding    -  Primary   Relevant Orders   Beta hCG quant (ref lab)   ABO AND RH    Beta hCG quant (ref lab)   Rh negative state in antepartum period, first trimester        Rhogam today       1) First trimester bleeding - incidence and clinical course of first trimester bleeding is discussed in detail with the patient today.  Approximately 1/3 of pregnancies ending in live births experienced  1st trimester bleeding.  The amount of bleeding is variable and not necessarily predictive of outcome.  Sources may be cervical or uterine.  Subchorionic hemorrhages are a frequent concurrent findings on ultrasound and are followed expectantly.  These often absorb or regress spontaneously although risk for expansion and further disruption of the utero-placental interface leading to miscarriage is possible.  There is no clearly documented benefit to limiting or modifying activity and sexual intercourse  in altering clinic course of 1st trimester bleeding.    2) If not already done will proceed with TVUS evaluation to document viability, and if uncertain viability or absence of a demonstrable IUP (and no previous documentation of IUP) will trend HCG levels.  3) The patient is Rh NEG, rhogam is therefore indicated to decrease the risk rhesus alloimmunization.    4) Routine bleeding precautions were discussed with the patient prior the conclusion of today's visit.  Annamarie Major, MD, [facility], [facility] 05/23/2021  9:41 AM

## 2021-05-23 NOTE — Patient Instructions (Signed)
Rh Incompatibility Rh incompatibility is a condition that can happen during pregnancy if you have Rh-negative blood and your baby has Rh-positive blood. Rh-negative and Rh-positive refers to whether your blood has a specific protein found on the surface of red blood cells (Rh factor). If you have Rh factor on your blood cells, you are Rh-positive. If you do not have an Rh factor, you are Rh-negative. Being Rh-negative does not affect your health, but it can cause problems during your pregnancy. How does Rh incompatibility affect me? During pregnancy, blood from your baby can cross into your bloodstream, especially during delivery. If you are Rh-negative and your baby is Rh-positive, your body's defense system (immune system) will react to your baby's blood as if it were a foreign substance and will create certain proteins (antibodies) in response to it. This process is called sensitization. Rh incompatibility can also happen if you are Rh-negative and you receive a donation (transfusion) of Rh-positive blood. How does Rh incompatibility affect my baby? If you become sensitized, your Rh antibodies will cross the placenta in future pregnancies and attack your baby's blood if your baby's blood is Rh-positive. This attack on your baby's blood cells can lead to a very serious problem called hemolytic disease. This condition causes the baby's red blood cells to be destroyed faster than normal or to break down too quickly. This can cause: Low amounts of red blood cells (anemia). Yellowing of the skin and the whites of the eyes (jaundice). Swelling of the body (edema). Brain damage. Heart failure. Death. These antibodies usually do not cause problems during your first pregnancy. This is because blood from your baby often crosses into your bloodstream during delivery, and your baby is born before many of the antibodies can develop. However, once antibodies have formed, they stay in your body. Because of this,  Rh incompatibility is more likely to cause problems in second or later pregnancies if the baby is Rh-positive. How do I know if I have this condition? When you become pregnant, you may have blood tests to determine your blood type and Rh factor. If you are Rh-negative, you may have another blood test called an antibody screen. The antibody screen shows whether you have Rh antibodies in your blood. If you do, it may mean that you have been exposed to Rh-positive blood before, and that you have a risk for Rh incompatibility. To find out whether your baby is developing hemolytic disease and how serious it is, health care providers may use more tests, such as an ultrasound. How is this condition treated? This condition is treated with a medicine called Rho (D) immune globulin. This medicine keeps your body from making antibodies that can cause serious problems for your baby or for future babies. You will get one shot around week 28 of your pregnancy and the other within 72 hours of your baby's birth. If you are Rh-negative, you will need this medicine every time you have a baby with Rh-positive blood. If you are Rh-negative and there is a high risk of blood transfer between you and your baby, you may be given Rho (D) immune globulin. The risk of blood transfer is high if you experience: Amniocentesis. This is a procedure to remove a small amount of the fluid that surrounds a baby in the uterus (amniotic fluid) so that it can be tested. A miscarriage or an abortion. An ectopic pregnancy. This is a pregnancy in which the fertilized egg attaches (implants) outside the uterus. Any vaginal bleeding   during pregnancy. Trauma to your belly area during your pregnancy. A procedure to turn your baby to a head-down position for delivery (version). If you already have antibodies in your blood, your pregnancy will be closely monitored. You will not be given Rho (D) immune globulin because it is not effective in these  cases. Summary Rh incompatibility is a condition that occurs during pregnancy if you have Rh-negative blood and your baby has Rh-positive blood. If you are Rh-negative and your baby is Rh-positive, your immune system will react to the baby's blood and will create Rh antibodies. Rh antibodies can attack your baby's red blood cells and can lead to hemolytic disease. This condition can cause a serious anemia in your baby. Rh incompatibility is treated with a medicine called Rho (D) immune globulin. This medicine keeps your body from making antibodies that can cause serious problems in your baby or future babies. This information is not intended to replace advice given to you by your health care provider. Make sure you discuss any questions you have with your health care provider. Document Revised: 09/08/2020 Document Reviewed: 09/08/2020 Elsevier Patient Education  2022 Elsevier Inc.  

## 2021-05-23 NOTE — Addendum Note (Signed)
Addended by: Cornelius Moras D on: 05/23/2021 09:49 AM   Modules accepted: Orders

## 2021-05-25 ENCOUNTER — Other Ambulatory Visit: Payer: Self-pay

## 2021-05-25 ENCOUNTER — Other Ambulatory Visit

## 2021-05-25 DIAGNOSIS — O209 Hemorrhage in early pregnancy, unspecified: Secondary | ICD-10-CM

## 2021-05-25 LAB — ABO AND RH: Rh Factor: NEGATIVE

## 2021-05-25 LAB — BETA HCG QUANT (REF LAB): hCG Quant: 14 m[IU]/mL

## 2021-05-25 LAB — ANTIBODY SCREEN: Antibody Screen: NEGATIVE

## 2021-05-26 LAB — BETA HCG QUANT (REF LAB): hCG Quant: 9 m[IU]/mL

## 2021-05-26 NOTE — Progress Notes (Signed)
Sch Nexplanon Fri if opening or Tues 1110 w me or any other time pt prefers

## 2021-05-27 ENCOUNTER — Telehealth: Payer: Self-pay

## 2021-05-27 NOTE — Telephone Encounter (Signed)
Called and left voicemail for patient to call back to be scheduled. 

## 2021-05-27 NOTE — Telephone Encounter (Signed)
-----   Message from Nadara Mustard, MD sent at 05/26/2021  6:00 PM EDT ----- Sch Nexplanon Fri if opening or Tues 1110 w me or any other time pt prefers

## 2021-06-14 ENCOUNTER — Telehealth: Payer: Self-pay | Admitting: Obstetrics & Gynecology

## 2021-06-14 NOTE — Telephone Encounter (Signed)
Pt is scheduled with [facility] on 8/3 for Nexplanon placement at 3:50.

## 2021-06-20 NOTE — Telephone Encounter (Signed)
Noted. Will order to arrive by apt date/time. 

## 2021-06-29 ENCOUNTER — Ambulatory Visit (INDEPENDENT_AMBULATORY_CARE_PROVIDER_SITE_OTHER): Admitting: Obstetrics & Gynecology

## 2021-06-29 ENCOUNTER — Encounter: Payer: Self-pay | Admitting: Obstetrics & Gynecology

## 2021-06-29 ENCOUNTER — Other Ambulatory Visit: Payer: Self-pay

## 2021-06-29 VITALS — BP 120/80 | Ht 64.0 in | Wt 209.0 lb

## 2021-06-29 DIAGNOSIS — Z30017 Encounter for initial prescription of implantable subdermal contraceptive: Secondary | ICD-10-CM

## 2021-06-29 LAB — POCT URINE PREGNANCY: Preg Test, Ur: NEGATIVE

## 2021-06-29 NOTE — Patient Instructions (Signed)
Etonogestrel Implant What is this medication? ETONOGESTREL (et oh noe JES trel) prevents ovulation and pregnancy. It belongs to a group of medications called contraceptives. This medication is a progestin hormone. This medicine may be used for other purposes; ask your health care provider or pharmacist if you have questions. COMMON BRAND NAME(S): Implanon, Nexplanon What should I tell my care team before I take this medication? They need to know if you have any of these conditions: Abnormal vaginal bleeding Blood vessel disease or blood clots Breast, cervical, endometrial, ovarian, liver, or uterine cancer Diabetes Gallbladder disease Heart disease or recent heart attack High blood pressure High cholesterol or triglycerides Kidney disease Liver disease Migraine headaches Seizures Stroke Tobacco smoker An unusual or allergic reaction to etonogestrel, anesthetics or antiseptics, other medications, foods, dyes, or preservatives Pregnant or trying to get pregnant Breast-feeding How should I use this medication? This device is inserted just under the skin on the inner side of your upper arm by your care team. Talk to your care team about the use of this medication in children. Special care may be needed. Overdosage: If you think you have taken too much of this medicine contact a poison control center or emergency room at once. NOTE: This medicine is only for you. Do not share this medicine with others. What if I miss a dose? This does not apply. What may interact with this medication? Do not take this medication with any of the following: Amprenavir Fosamprenavir This medication may also interact with the following: Acitretin Aprepitant Armodafinil Bexarotene Bosentan Carbamazepine Certain medications for fungal infections like fluconazole, ketoconazole, itraconazole and voriconazole Certain medications to treat hepatitis, HIV or  AIDS Cyclosporine Felbamate Griseofulvin Lamotrigine Modafinil Oxcarbazepine Phenobarbital Phenytoin Primidone Rifabutin Rifampin Rifapentine St. John's wort Topiramate This list may not describe all possible interactions. Give your health care provider a list of all the medicines, herbs, non-prescription drugs, or dietary supplements you use. Also tell them if you smoke, drink alcohol, or use illegal drugs. Some items may interact with your medicine. What should I watch for while using this medication? This product does not protect you against HIV infection (AIDS) or other sexually transmitted diseases. You should be able to feel the implant by pressing your fingertips over the skin where it was inserted. Contact your care team if you cannot feel the implant, and use a non-hormonal birth control method (such as condoms) until your care team confirms that the implant is in place. Contact your care team if you think that the implant may have broken or become bent while in your arm. You will receive a user card from your care team after the implant is inserted. The card is a record of the location of the implant in your upper arm and when it should be removed. Keep this card with your health records. What side effects may I notice from receiving this medication? Side effects that you should report to your care team as soon as possible: Allergic reactions-skin rash, itching, hives, swelling of the face, lips, tongue, or throat Blood clot-pain, swelling, or warmth in the leg, shortness of breath, chest pain Gallbladder problems-severe stomach pain, nausea, vomiting, fever Increase in blood pressure Liver injury-right upper belly pain, loss of appetite, nausea, light-colored stool, dark yellow or brown urine, yellowing skin or eyes, unusual weakness or fatigue New or worsening migraines or headaches Pain, redness, or irritation at injection site Stroke-sudden numbness or weakness of the face,  arm, or leg, trouble speaking, confusion, trouble walking, loss   vision Unusual vaginal discharge, itching, or odor Worsening mood, feelings of depression Side effects that usually do not require medical attention (report to your careteam if they continue or are bothersome): Breast pain or tenderness Dark patches of skin on the face or other sun-exposed areas Irregular menstrual cycles or spotting Nausea Weight gain This list may not describe all possible side effects. Call your doctor for medical advice about side effects. You may report side effects to FDA at1-800-FDA-1088. Where should I keep my medication? This medication is given in a hospital or clinic and will not be stored at home. NOTE: This sheet is a summary. It may not cover all possible information. If you have questions about this medicine, talk to your doctor, pharmacist, orhealth care provider.  2022 Elsevier/Gold Standard (2020-12-21 09:15:27)

## 2021-06-29 NOTE — Progress Notes (Signed)
  Nexplanon Insertion  Patient given informed consent, signed copy in the chart, time out was performed. Pregnancy test was Neg. Appropriate time out taken.  Patient's Left arm was prepped and draped in the usual sterile fashion.. The ruler used to measure and mark insertion area.  Pt was prepped with betadine swab and then injected with 3 cc of 2% lidocaine with epinephrine. Nexplanon removed form packaging,  Device confirmed in needle, then inserted full length of needle and withdrawn per handbook instructions.  Pt insertion site covered with steri-strip and a bandage.   Minimal blood loss.  Pt tolerated the procedure well.   Annamarie Major, MD, [facility], [facility] 06/29/2021  4:20 PM

## 2021-09-19 NOTE — Telephone Encounter (Signed)
Nexplanon rcvd/charged 06/29/21

## 2021-10-07 IMAGING — US US PELVIS COMPLETE TRANSABD/TRANSVAG W DUPLEX
1 series · 13 of 25 positions shown · non-contrast
Comparison: Ultrasound 01/03/2018 and CT scan 12/25/2016

CLINICAL DATA: Lower abdominal/pelvic pain for 1 year.



[Series 1: us pelvic complete w transvaginal and torsion righ · 13 of 98 slices shown]
[im 1/98]
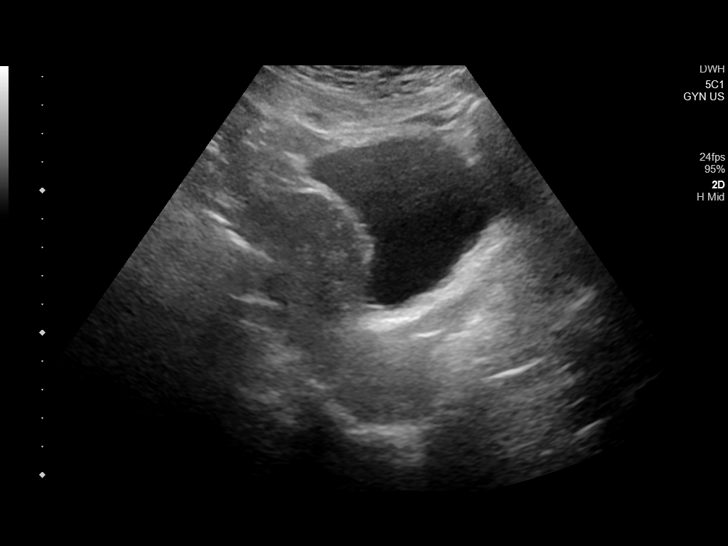
[im 9/98]
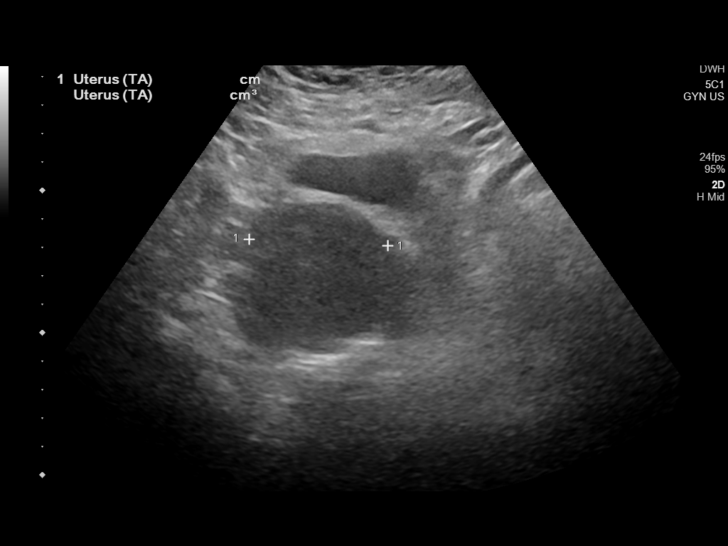
[im 17/98]
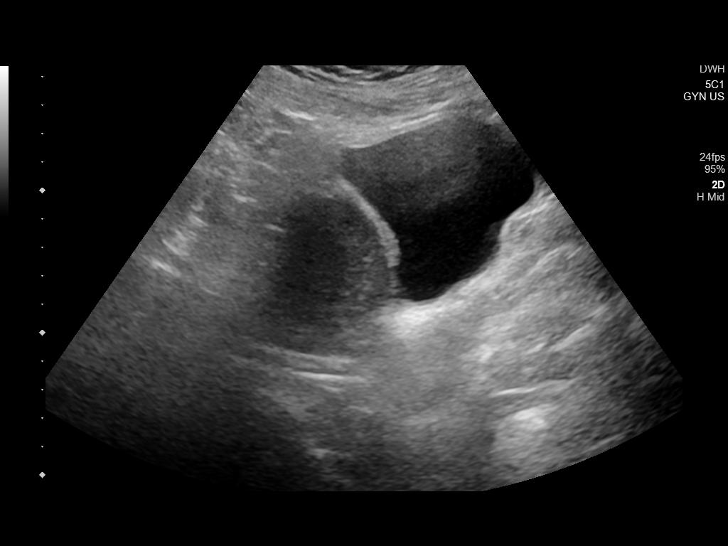
[im 25/98]
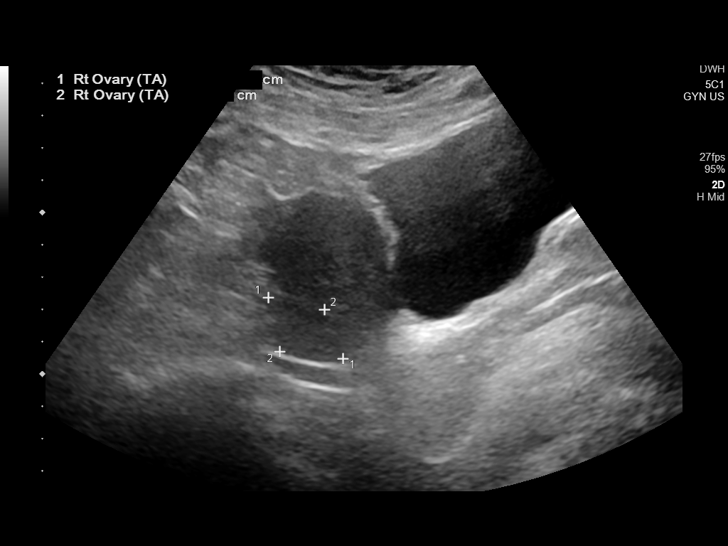
[im 33/98]
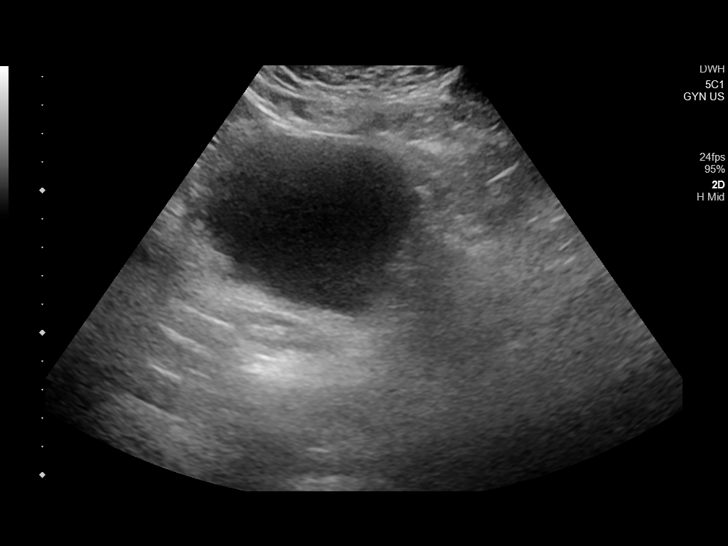
[im 41/98]
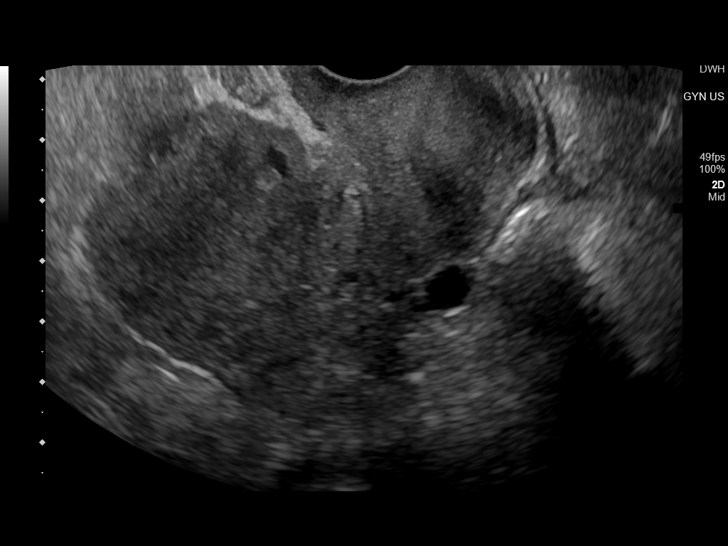
[im 49/98]
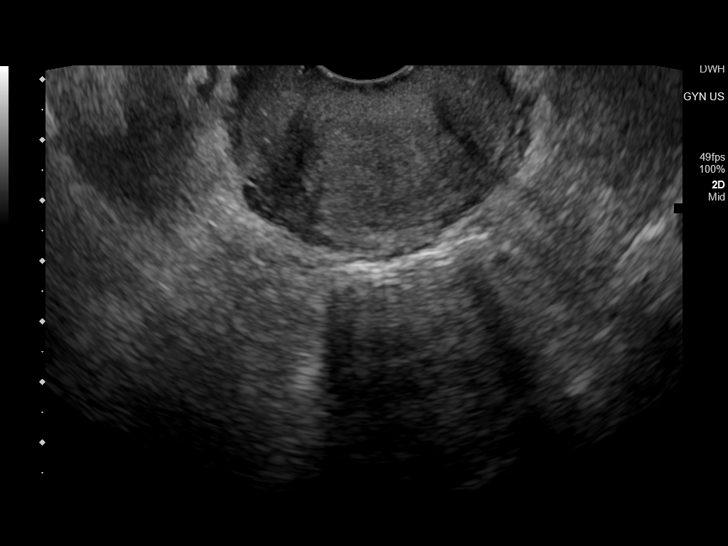
[im 57/98]
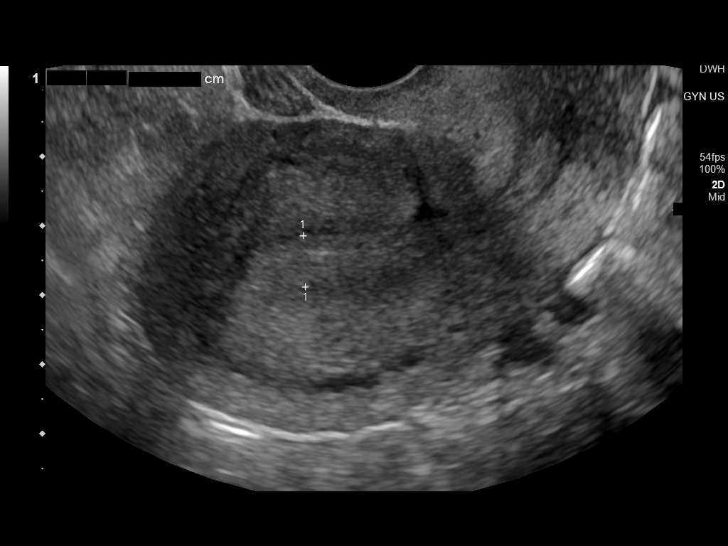
[im 65/98]
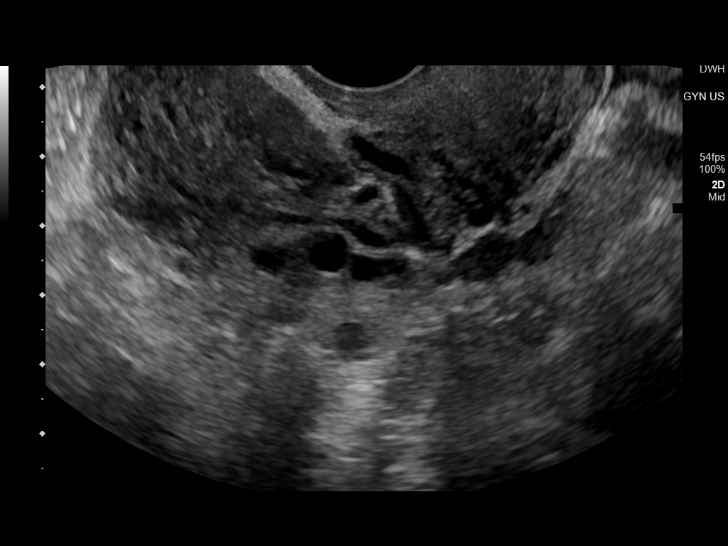
[im 73/98]
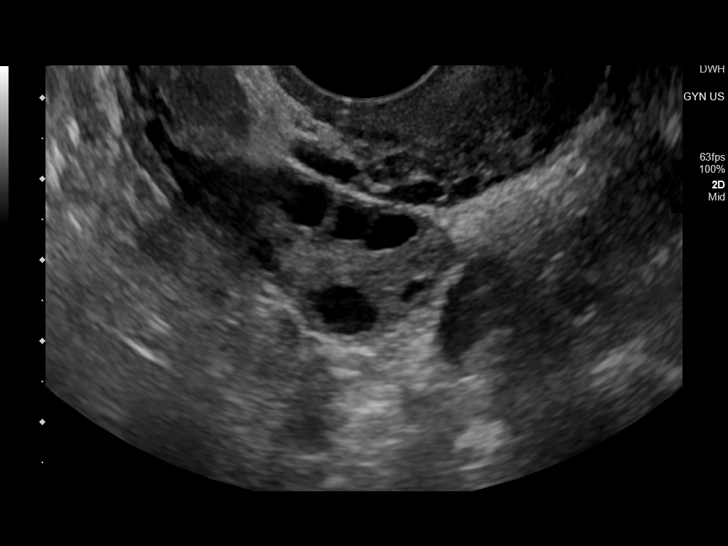
[im 81/98]
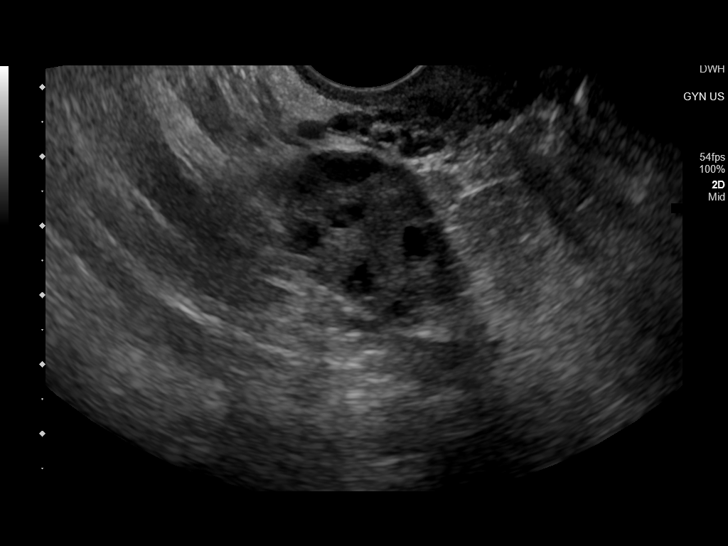
[im 89/98]
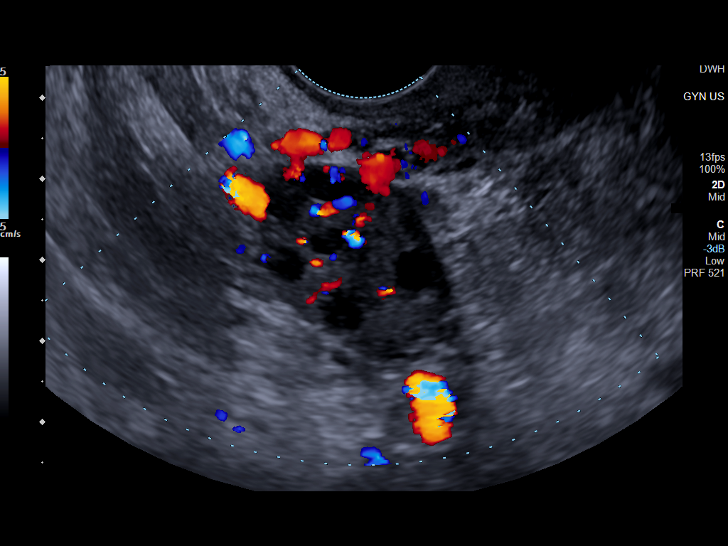
[im 98/98]
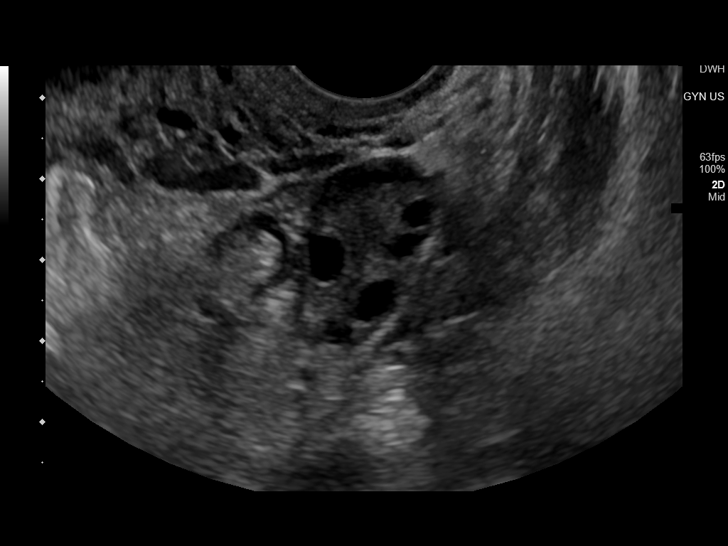

[13 of 25 positions shown; findings below may reference images not displayed]

FINDINGS: Uterus

Measurements: 9.2 x 3.4 x 4.9 cm = volume: 78.6 mL. No myometrial
abnormalities are identified.

Endometrium

Thickness: 7.4 mm.  No focal abnormality visualized.

Right ovary

Measurements: 3.8 x 1.8 x 2.6 cm = volume: 9.5 mL. Normal
appearance/no adnexal mass.

Left ovary

Measurements: 3.3 x 2.0 x 1.6 cm = volume: 5.3 mL. Normal
appearance/no adnexal mass.

Pulsed Doppler evaluation of both ovaries demonstrates normal
low-resistance arterial and venous waveforms.

Other findings

Trace free pelvic fluid
IMPRESSION: Normal pelvic ultrasound examination.

## 2021-11-15 ENCOUNTER — Ambulatory Visit: Admitting: Dermatology

## 2021-11-29 ENCOUNTER — Ambulatory Visit: Admitting: Dermatology

## 2021-12-28 ENCOUNTER — Ambulatory Visit

## 2022-01-02 ENCOUNTER — Ambulatory Visit (LOCAL_COMMUNITY_HEALTH_CENTER)

## 2022-01-02 ENCOUNTER — Other Ambulatory Visit: Payer: Self-pay

## 2022-01-02 DIAGNOSIS — Z23 Encounter for immunization: Secondary | ICD-10-CM

## 2022-01-02 NOTE — Progress Notes (Signed)
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 Per Care Everywhere, Pt had Hep B  titer (Non Reactive) on  12/21/2021. Per [facility] , pt completed Hep B series in 2000. Per [facility] Dr Ralene Bathe, one dose of Hep B vaccine is indicated and advised to repeat Hep B titer in 4 - 8 weeks. If repeat titer is low, complete Hep B series. If repeat titer WNL, no further Hep B vaccines indicated. RN explained this to pt. Questions answered and reports understanding.  Vaccines received today: Heplisav B, Flu, and Varicella. [facility] updated and copy given to pt with reminder for repeat Hep B titer. Tolerated vaccines well today. Jerel Shepherd, RN

## 2022-01-17 ENCOUNTER — Ambulatory Visit: Admitting: Dermatology

## 2022-01-17 ENCOUNTER — Ambulatory Visit

## 2022-01-18 ENCOUNTER — Other Ambulatory Visit: Payer: Self-pay

## 2022-01-18 ENCOUNTER — Ambulatory Visit (LOCAL_COMMUNITY_HEALTH_CENTER)

## 2022-01-18 DIAGNOSIS — Z7185 Encounter for immunization safety counseling: Secondary | ICD-10-CM

## 2022-01-18 NOTE — Progress Notes (Signed)
In Nurse Clinic requesting review of immunization record. 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 Explains that [facility] is requesting another Tdap. Per [facility] review, pt had Tdap 04/27/2016 which is UTD. RN explained this to pt. [facility] copy given to pt. Questions answered and reports understanding. Jerel Shepherd, RN

## 2022-12-26 DIAGNOSIS — F4312 Post-traumatic stress disorder, chronic: Secondary | ICD-10-CM | POA: Diagnosis not present

## 2022-12-26 DIAGNOSIS — F411 Generalized anxiety disorder: Secondary | ICD-10-CM | POA: Diagnosis not present

## 2022-12-26 DIAGNOSIS — F1220 Cannabis dependence, uncomplicated: Secondary | ICD-10-CM | POA: Diagnosis not present

## 2022-12-26 DIAGNOSIS — F5105 Insomnia due to other mental disorder: Secondary | ICD-10-CM | POA: Diagnosis not present

## 2022-12-26 DIAGNOSIS — D519 Vitamin B12 deficiency anemia, unspecified: Secondary | ICD-10-CM | POA: Diagnosis not present

## 2022-12-26 DIAGNOSIS — F3181 Bipolar II disorder: Secondary | ICD-10-CM | POA: Diagnosis not present

## 2023-02-16 DIAGNOSIS — S61215A Laceration without foreign body of left ring finger without damage to nail, initial encounter: Secondary | ICD-10-CM | POA: Diagnosis not present

## 2023-02-28 ENCOUNTER — Ambulatory Visit (HOSPITAL_COMMUNITY): Admission: EM | Admit: 2023-02-28 | Discharge: 2023-02-28 | Disposition: A | Discharge status: Home / Self Care

## 2023-02-28 DIAGNOSIS — Z79899 Other long term (current) drug therapy: Secondary | ICD-10-CM

## 2023-02-28 NOTE — Discharge Summary (Signed)
Pt left facility after triage.

## 2023-02-28 NOTE — ED Provider Notes (Signed)
Behavioral Health Urgent Care Medical Screening Exam  Patient Name: Chelsea Vargas MRN: [id number] Date of Evaluation: 02/28/23 Chief Complaint:  "I need medication refills" Diagnosis:  Final diagnoses:  Medication management   Chelsea Vargas 27 y.o., female patient  with history Bipolar 2 disorder and MDD, presented to [facility] as a walk in voluntarily, unaccompanied, seeking medication management.   Chelsea Vargas, 27 y.o., female patient seen face to face by this provider, consulted with Dr. Dwyane Dee; and chart reviewed on 02/28/23.   On evaluation Chelsea Vargas reports recently establishing mental health serves with [facility] in [place] and completed one of two outpatient therapy sessions required before being able to establish care with psychiatry for medication management. Patient reports that she was a patient of Dr. Mayer Masker until approximately one month ago, however, was unable to continue afford the office visits and reports this is the reason she changed mental health providers to [facility]. On chart review of medications reconciliation, patient was at some point prescribed Celexa, Seroquel, Venlafaxine, and Latuda. According to review of med reconciliation, patient last filled Celexa in 12/05/2022. She reports being almost out of all of her Bipolar medications. Denies any other acute concerns. She currently has Insurance underwriter and [employer]. She is scheduled for her next therapy appointment at [place] this month.   During evaluation Chelsea Vargas is in no acute distress.  She is alert, oriented x 4, calm, cooperative and attentive. Her mood is euthymic with congruent affect.  She has normal speech, and behavior.  Objectively there is no evidence of psychosis/mania or delusional thinking.  Patient is able to converse coherently, goal directed thoughts, no distractibility, or pre-occupation. She also denies suicidal/self-harm/homicidal ideation, psychosis, and paranoia.  Patient answered  question appropriately.      [facility] from 02/28/2023 in [place] No Risk       Psychiatric Specialty Exam  Presentation  General Appearance:Appropriate for Environment  Eye Contact:Good  Speech:Clear and Coherent  Speech Volume:Normal  Handedness:Right   Mood and Affect  Mood: Euthymic  Affect: Appropriate   Thought Process  Thought Processes: Coherent  Descriptions of Associations:Intact  Orientation:Full (Time, Place and Person)  Thought Content:Logical    Hallucinations:None  Ideas of Reference:None  Suicidal Thoughts:No  Homicidal Thoughts:No   Sensorium  Memory: Immediate Good; Recent Good; Remote Good  Judgment: Intact  Insight: Good   Executive Functions  Concentration: Good  Attention Span: Good  Recall: Good  Fund of Knowledge: Good  Language: Good   Psychomotor Activity  Psychomotor Activity: Normal   Assets  Assets: Communication Skills; Desire for Improvement; Physical Health; Resilience   Sleep  Sleep: Good  Number of hours: No data recorded  Physical Exam: Physical Exam HENT:     Head: Normocephalic.  Eyes:     Extraocular Movements: Extraocular movements intact.     Pupils: Pupils are equal, round, and reactive to light.  Cardiovascular:     Rate and Rhythm: Normal rate.  Pulmonary:     Effort: Pulmonary effort is normal.  Neurological:     General: No focal deficit present.     Mental Status: She is alert.    Review of Systems  Psychiatric/Behavioral: Negative.      Blood pressure 113/69, pulse 70, temperature 98.5 F (36.9 C), temperature source Oral, resp. rate 18, SpO2 98 %. There is no height or weight on file to calculate BMI.  Musculoskeletal: Strength & Muscle  Tone: within normal limits Gait & Station: normal Patient leans: N/A   [facility] Discharge Disposition for Follow up and Recommendations: Based on my  evaluation the patient does not appear to have an emergency medical condition and can be discharged with resources and follow up care in outpatient services for Provided resources for outpatient medication management. Advised to continue with [facility] outpatient appointment. Return as needed.    Molli Barrows, FNP-C, PMHNP-BC  [facility] Urgent [phone]  02/28/2023, 3:43 PM
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
## 2023-03-03 ENCOUNTER — Emergency Department
Admission: EM | Admit: 2023-03-03 | Discharge: 2023-03-03 | Disposition: A | Discharge status: Home / Self Care | Payer: Commercial Managed Care - PPO | Attending: Emergency Medicine | Admitting: Emergency Medicine

## 2023-03-03 ENCOUNTER — Other Ambulatory Visit: Payer: Self-pay

## 2023-03-03 DIAGNOSIS — Z760 Encounter for issue of repeat prescription: Secondary | ICD-10-CM | POA: Diagnosis not present

## 2023-03-03 MED ORDER — HYDROXYZINE PAMOATE 50 MG PO CAPS: 50.0000 mg | ORAL_CAPSULE | Freq: Two times a day (BID) | ORAL | 0 refills | Status: DC | PRN

## 2023-03-03 MED ORDER — CITALOPRAM HYDROBROMIDE 40 MG PO TABS: 40.0000 mg | ORAL_TABLET | Freq: Every day | ORAL | 0 refills | Status: DC

## 2023-03-03 MED ORDER — ONDANSETRON 8 MG PO TBDP: 8.0000 mg | ORAL_TABLET | Freq: Three times a day (TID) | ORAL | 0 refills | Status: DC | PRN

## 2023-03-03 MED ORDER — OXCARBAZEPINE 300 MG PO TABS: ORAL_TABLET | ORAL | 0 refills | Status: DC

## 2023-03-03 MED ORDER — VENLAFAXINE HCL ER 75 MG PO CP24: 75.0000 mg | ORAL_CAPSULE | Freq: Every day | ORAL | 0 refills | Status: DC

## 2023-03-03 NOTE — ED Provider Notes (Signed)
[facility] Provider Note   Event Date/Time   First MD Initiated Contact with Patient 03/03/23 1130     (approximate) History  Medication Refill  HPI Roderic Ovenslison B Divita is a 27 y.o. female with stated past medical history of bipolar disorder who presents from [facility] after being told that they would not refill her medications.  Patient requesting refills for oxcarbazepine, hydroxyzine, citalopram, and venlafaxine.  Patient is also having nausea vomiting over the last few days that patient states is usual when she comes off of her medications.  Patient also states that she has been tearful and emotionally labile.  Patient's mother at bedside also states similar symptoms that she has noticed.  Denies SI, HI, AVH ROS: Patient currently denies any vision changes, tinnitus, difficulty speaking, facial droop, sore throat, chest pain, shortness of breath, abdominal pain, diarrhea, dysuria, or weakness/numbness/paresthesias in any extremity   Physical Exam  Triage Vital Signs: ED Triage Vitals  Enc Vitals Group     BP 03/03/23 1115 110/73     Pulse Rate 03/03/23 1115 68     Resp 03/03/23 1115 18     Temp 03/03/23 1115 98.5 F (36.9 C)     Temp Source 03/03/23 1115 Oral     SpO2 03/03/23 1115 93 %     Weight 03/03/23 1115 213 lb (96.6 kg)     Height 03/03/23 1115 5\' 3"  (1.6 m)     Head Circumference --      Peak Flow --      Pain Score 03/03/23 1123 0     Pain Loc --      Pain Edu? --      Excl. in GC? --    Most recent vital signs: Vitals:   03/03/23 1115  BP: 110/73  Pulse: 68  Resp: 18  Temp: 98.5 F (36.9 C)  SpO2: 93%   General: Awake, oriented x4. CV:  Good peripheral perfusion.  Resp:  Normal effort.  Abd:  No distention.  Other:  Young adult Caucasian female laying in bed in no acute distress. ED Results / Procedures / Treatments  PROCEDURES: Critical Care performed: No Procedures MEDICATIONS ORDERED IN ED: Medications - No data to  display IMPRESSION / MDM / ASSESSMENT AND PLAN / ED COURSE  I reviewed the triage vital signs and the nursing notes.                             The patient is on the cardiac monitor to evaluate for evidence of arrhythmia and/or significant heart rate changes. Patient's presentation is most consistent with acute presentation with potential threat to life or bodily function. [facility] Department today for a medication refill.  It is very important that you establish primary care with a physician if you have not already done so to gain an optimal regimen for your medical conditions. Please follow up with your primary care physician within two days. If you do not have a primary doctor, you can call your insurance company to find one.  If you do not have insurance, you can look for one on this of local clinics or go to the finance/registration department for more assistance.  Return to the Emergency Department if you experience chest pain, shortness of  homebreath, numbness/tingling, or any other concerning symptoms.  Dispo: Discharge   FINAL CLINICAL IMPRESSION(S) / ED DIAGNOSES   Final diagnoses:  Encounter  for medication refill   Rx / DC Orders   ED Discharge Orders          Ordered    Oxcarbazepine (TRILEPTAL) 300 MG tablet  Multiple Frequencies        03/03/23 1141    hydrOXYzine (VISTARIL) 50 MG capsule  2 times daily PRN        03/03/23 1141    citalopram (CELEXA) 40 MG tablet  Daily        03/03/23 1141    venlafaxine XR (EFFEXOR XR) 75 MG 24 hr capsule  Daily        03/03/23 1141    ondansetron (ZOFRAN-ODT) 8 MG disintegrating tablet  Every 8 hours PRN        03/03/23 1156           Note:  This document was prepared using Dragon voice recognition software and may include unintentional dictation errors.   Merwyn Katos, MD 03/03/23 [phone]

## 2023-03-03 NOTE — ED Triage Notes (Signed)
Pt to ED with mother for medication refill. Pt is in between providers and needs to make appt with [facility] and has been out of meds for 3 weeks. Pt is teary, mother states this happens when she is off bipolar meds. Pt vomited this AM but does not want to be seen for this, just wants med refill.  Pt takes: Oxycarbamazepine 300mg : one tablet in morning, 2 tablets in evening Hydroxyzine 50mg  up to twice daily as needed Citalopram 40mg  once daily Venlafaxine 75mg  once daily

## 2023-03-03 NOTE — ED Triage Notes (Signed)
First nurse note: Pt from [facility] c/o intermittent vomiting x3 weeks. Reports worse for last 2-3 days. Reports ran out of Trileptal per [facility] report. Reports was d/c from OP psych however does have a follow-up with [facility] per [facility].

## 2023-03-13 ENCOUNTER — Other Ambulatory Visit: Payer: Self-pay

## 2023-03-13 MED ORDER — VENLAFAXINE HCL ER 75 MG PO CP24
150.0000 mg | ORAL_CAPSULE | Freq: Every day | ORAL | 0 refills | Status: DC
  Filled 2023-03-13: qty 60, 30d supply, fill #0

## 2023-03-13 MED ORDER — CITALOPRAM HYDROBROMIDE 40 MG PO TABS
40.0000 mg | ORAL_TABLET | Freq: Every day | ORAL | 0 refills | Status: DC
  Filled 2023-03-13: qty 30, 30d supply, fill #0

## 2023-03-13 MED ORDER — OXCARBAZEPINE 300 MG PO TABS
ORAL_TABLET | ORAL | 0 refills | Status: DC
  Filled 2023-03-13: qty 90, 30d supply, fill #0

## 2023-03-13 MED ORDER — HYDROXYZINE PAMOATE 50 MG PO CAPS
50.0000 mg | ORAL_CAPSULE | Freq: Two times a day (BID) | ORAL | 0 refills | Status: DC | PRN
  Filled 2023-03-13: qty 60, 30d supply, fill #0

## 2023-03-25 NOTE — Progress Notes (Deleted)
[facility]   No chief complaint on file.   HPI:      [name] is a 27 y.o. G2P1001 whose LMP was Patient's last menstrual period was 02/25/2023 (approximate)., presents today for BTB with nexplanon, placed 06/29/21.   Neg pap/neg STD testing 03/29/21  Patient Active Problem List   Diagnosis Date Noted   Supervision of normal pregnancy 03/29/2021   Marijuana use 03/29/2021   BMI 35.0-35.9,adult 03/29/2021   Bipolar disorder (HCC) 10/07/2018   Hydrosalpinx 01/03/2018    Past Surgical History:  Procedure Laterality Date   CESAREAN SECTION     TONSILLECTOMY      Family History  Problem Relation Age of Onset   Breast cancer Paternal Grandmother    Breast cancer Maternal Aunt    Breast cancer Maternal Aunt    Breast cancer Other    Breast cancer Other 63   Rheum arthritis Mother    Cirrhosis Mother        non alcoholic   Diabetes Mother    Cancer Father     Social History   Socioeconomic History   Marital status: Married    Spouse name: Not on file   Number of children: Not on file   Years of education: Not on file   Highest education level: Not on file  Occupational History   Occupation: school  Tobacco Use   Smoking status: Never   Smokeless tobacco: Never  Vaping Use   Vaping Use: Every day   Substances: Nicotine  Substance and Sexual Activity   Alcohol use: Yes    Comment: occassional   Drug use: No   Sexual activity: Yes    Birth control/protection: None, Condom  Other Topics Concern   Not on file  Social History Narrative   Not on file   Social Determinants of Health   Financial Resource Strain: Not on file  Food Insecurity: Not on file  Transportation Needs: Not on file  Physical Activity: Not on file  Stress: Not on file  Social Connections: Not on file  Intimate Partner Violence: Not on file    Outpatient Medications Prior to Visit  Medication Sig Dispense Refill   citalopram (CELEXA) 40 MG tablet Take 1 tablet  (40 mg total) by mouth daily. 30 tablet 0   citalopram (CELEXA) 40 MG tablet Take 1 tablet (40 mg total) by mouth daily. 30 tablet 0   hydrOXYzine (VISTARIL) 50 MG capsule Take 1 capsule (50 mg total) by mouth 2 (two) times daily as needed for anxiety. 60 capsule 0   hydrOXYzine (VISTARIL) 50 MG capsule Take 1 capsule (50 mg total) by mouth 2 (two) times daily as needed. 60 capsule 0   ondansetron (ZOFRAN-ODT) 8 MG disintegrating tablet Take 1 tablet (8 mg total) by mouth every 8 (eight) hours as needed for nausea or vomiting. 20 tablet 0   Oxcarbazepine (TRILEPTAL) 300 MG tablet Take 2 tablets (600 mg total) by mouth every morning AND 1 tablet (300 mg total) every evening. 90 tablet 0   Oxcarbazepine (TRILEPTAL) 300 MG tablet 1 tablet (300 mg total) every morning AND 2 tablets (600 mg total) at bedtime. 90 tablet 0   QUEtiapine (SEROQUEL) 50 MG tablet Take 1 tablet by mouth daily.  2   venlafaxine XR (EFFEXOR XR) 75 MG 24 hr capsule Take 1 capsule (75 mg total) by mouth daily. 30 capsule 0   venlafaxine XR (EFFEXOR-XR) 75 MG 24 hr capsule Take 2 capsules (150 mg  total) by mouth daily. 60 capsule 0   No facility-administered medications prior to visit.      ROS:  Review of Systems BREAST: No symptoms   OBJECTIVE:   Vitals:  LMP 02/25/2023 (Approximate)   Physical Exam  Results: No results found for this or any previous visit (from the past 24 hour(s)).   Assessment/Plan: No diagnosis found.    No orders of the defined types were placed in this encounter.     No follow-ups on file.  Torrey Ballinas B. Dayzha Pogosyan, PA-C 03/25/2023 5:56 PM

## 2023-03-26 ENCOUNTER — Ambulatory Visit: Admitting: Obstetrics and Gynecology

## 2023-03-26 DIAGNOSIS — Z113 Encounter for screening for infections with a predominantly sexual mode of transmission: Secondary | ICD-10-CM

## 2023-03-26 DIAGNOSIS — N921 Excessive and frequent menstruation with irregular cycle: Secondary | ICD-10-CM

## 2023-03-29 ENCOUNTER — Other Ambulatory Visit: Payer: Self-pay

## 2023-03-29 ENCOUNTER — Ambulatory Visit (HOSPITAL_COMMUNITY)
Admission: EM | Admit: 2023-03-29 | Discharge: 2023-03-30 | Disposition: A | Discharge status: Other Acute Inpatient Hospital | Payer: Commercial Managed Care - PPO | Attending: Behavioral Health | Admitting: Behavioral Health

## 2023-03-29 ENCOUNTER — Encounter (HOSPITAL_COMMUNITY): Payer: Self-pay | Admitting: Behavioral Health

## 2023-03-29 DIAGNOSIS — F332 Major depressive disorder, recurrent severe without psychotic features: Secondary | ICD-10-CM | POA: Diagnosis present

## 2023-03-29 DIAGNOSIS — R45851 Suicidal ideations: Secondary | ICD-10-CM | POA: Diagnosis not present

## 2023-03-29 DIAGNOSIS — Z9151 Personal history of suicidal behavior: Secondary | ICD-10-CM | POA: Insufficient documentation

## 2023-03-29 DIAGNOSIS — F4310 Post-traumatic stress disorder, unspecified: Secondary | ICD-10-CM | POA: Diagnosis not present

## 2023-03-29 DIAGNOSIS — F3181 Bipolar II disorder: Secondary | ICD-10-CM | POA: Diagnosis not present

## 2023-03-29 LAB — CBC WITH DIFFERENTIAL/PLATELET
Abs Immature Granulocytes: 0.03 10*3/uL (ref 0.00–0.07)
Basophils Absolute: 0.1 10*3/uL (ref 0.0–0.1)
Basophils Relative: 1 %
Eosinophils Absolute: 0 10*3/uL (ref 0.0–0.5)
Eosinophils Relative: 0 %
HCT: 39.5 % (ref 36.0–46.0)
Hemoglobin: 12.5 g/dL (ref 12.0–15.0)
Immature Granulocytes: 0 %
Lymphocytes Relative: 27 %
Lymphs Abs: 2.7 10*3/uL (ref 0.7–4.0)
MCH: 25.4 pg — ABNORMAL LOW (ref 26.0–34.0)
MCHC: 31.6 g/dL (ref 30.0–36.0)
MCV: 80.3 fL (ref 80.0–100.0)
Monocytes Absolute: 0.6 10*3/uL (ref 0.1–1.0)
Monocytes Relative: 6 %
Neutro Abs: 6.4 10*3/uL (ref 1.7–7.7)
Neutrophils Relative %: 66 %
Platelets: 369 10*3/uL (ref 150–400)
RBC: 4.92 MIL/uL (ref 3.87–5.11)
RDW: 14.1 % (ref 11.5–15.5)
WBC: 9.8 10*3/uL (ref 4.0–10.5)
nRBC: 0 % (ref 0.0–0.2)

## 2023-03-29 LAB — POCT URINE DRUG SCREEN - MANUAL ENTRY (I-SCREEN)
POC Amphetamine UR: NOT DETECTED
POC Buprenorphine (BUP): NOT DETECTED
POC Cocaine UR: NOT DETECTED
POC Marijuana UR: POSITIVE — AB
POC Methadone UR: NOT DETECTED
POC Methamphetamine UR: NOT DETECTED
POC Morphine: NOT DETECTED
POC Oxazepam (BZO): NOT DETECTED
POC Oxycodone UR: NOT DETECTED
POC Secobarbital (BAR): NOT DETECTED

## 2023-03-29 LAB — LIPID PANEL
Cholesterol: 225 mg/dL — ABNORMAL HIGH (ref 0–200)
HDL: 52 mg/dL (ref >40)
LDL Cholesterol: 160 mg/dL — ABNORMAL HIGH (ref 0–99)
Total CHOL/HDL Ratio: 4.3 RATIO
Triglycerides: 63 mg/dL (ref <150)
VLDL: 13 mg/dL (ref 0–40)

## 2023-03-29 LAB — COMPREHENSIVE METABOLIC PANEL
ALT: 31 U/L (ref 0–44)
AST: 23 U/L (ref 15–41)
Albumin: 4.3 g/dL (ref 3.5–5.0)
Alkaline Phosphatase: 76 U/L (ref 38–126)
Anion gap: 10 (ref 5–15)
BUN: 7 mg/dL (ref 6–20)
CO2: 24 mmol/L (ref 22–32)
Calcium: 9.1 mg/dL (ref 8.9–10.3)
Chloride: 104 mmol/L (ref 98–111)
Creatinine, Ser: 0.71 mg/dL (ref 0.44–1.00)
GFR, Estimated: >60 mL/min (ref >60)
Glucose, Bld: 87 mg/dL (ref 70–99)
Potassium: 3.4 mmol/L — ABNORMAL LOW (ref 3.5–5.1)
Sodium: 138 mmol/L (ref 135–145)
Total Bilirubin: 0.6 mg/dL (ref 0.3–1.2)
Total Protein: 7.3 g/dL (ref 6.5–8.1)

## 2023-03-29 LAB — MAGNESIUM: Magnesium: 2.2 mg/dL (ref 1.7–2.4)

## 2023-03-29 LAB — TSH: TSH: 0.672 u[IU]/mL (ref 0.350–4.500)

## 2023-03-29 LAB — HEMOGLOBIN A1C
Hgb A1c MFr Bld: 5.3 % (ref 4.8–5.6)
Mean Plasma Glucose: 105.41 mg/dL

## 2023-03-29 LAB — ETHANOL: Alcohol, Ethyl (B): <10 mg/dL (ref <10)

## 2023-03-29 MED ORDER — ACETAMINOPHEN 325 MG PO TABS: 650.0000 mg | ORAL_TABLET | Freq: Four times a day (QID) | ORAL | Status: DC | PRN

## 2023-03-29 MED ORDER — ALUM & MAG HYDROXIDE-SIMETH 200-200-20 MG/5ML PO SUSP: 30.0000 mL | ORAL | Status: DC | PRN

## 2023-03-29 MED ORDER — VENLAFAXINE HCL ER 150 MG PO CP24
150.0000 mg | ORAL_CAPSULE | Freq: Every day | ORAL | Status: DC
  Administered 2023-03-30: 150 mg via ORAL
  Filled 2023-03-29: qty 1

## 2023-03-29 MED ORDER — OXCARBAZEPINE 300 MG PO TABS
300.0000 mg | ORAL_TABLET | Freq: Two times a day (BID) | ORAL | Status: DC
  Administered 2023-03-29 – 2023-03-30 (×2): 300 mg via ORAL
  Filled 2023-03-29 (×2): qty 1

## 2023-03-29 MED ORDER — TRAZODONE HCL 50 MG PO TABS
50.0000 mg | ORAL_TABLET | Freq: Every evening | ORAL | Status: DC | PRN
  Administered 2023-03-29: 50 mg via ORAL
  Filled 2023-03-29: qty 1

## 2023-03-29 MED ORDER — MAGNESIUM HYDROXIDE 400 MG/5ML PO SUSP: 30.0000 mL | Freq: Every day | ORAL | Status: DC | PRN

## 2023-03-29 MED ORDER — CITALOPRAM HYDROBROMIDE 20 MG PO TABS
40.0000 mg | ORAL_TABLET | Freq: Every day | ORAL | Status: DC
  Administered 2023-03-29 – 2023-03-30 (×2): 40 mg via ORAL
  Filled 2023-03-29 (×2): qty 2

## 2023-03-29 MED ORDER — HYDROXYZINE HCL 25 MG PO TABS
25.0000 mg | ORAL_TABLET | Freq: Three times a day (TID) | ORAL | Status: DC | PRN
  Administered 2023-03-29: 25 mg via ORAL
  Filled 2023-03-29: qty 1

## 2023-03-29 NOTE — ED Notes (Signed)
Pt sleeping at present, no distress noted.  Monitoring for safety. 

## 2023-03-29 NOTE — ED Notes (Signed)
Pt A&O x 3, sad & tearful, resting at present.  No distress noted.  SI, denies HI.  Monitoring for safety.

## 2023-03-29 NOTE — ED Notes (Signed)
Voluntary Form faxed to [facility]. 

## 2023-03-29 NOTE — ED Notes (Signed)
Patient A&Ox4. Patient endorse SI with a plan to cut wrist. Patient report VH seeing shadows. Denies HI and AH. Patient denies any physical complaints when asked. No acute distress noted. Support and encouragement provided. Routine safety checks conducted according to facility protocol. Encouraged patient to notify staff if thoughts of harm toward self or others arise. Patient verbalize understanding and agreement. Will continue to monitor for safety.

## 2023-03-29 NOTE — ED Provider Notes (Addendum)
[facility] Continuous Assessment Admission H&P  Date: 03/29/23 Patient Name: Chelsea Vargas MRN: [id number] Chief Complaint: "I was at [facility] crisis center today and told them how I was feeling"  Diagnoses:  Final diagnoses:  Suicidal ideation  Severe episode of recurrent major depressive disorder, without psychotic features (HCC)    HPI: Chelsea Vargas is a 27 y.o. female patient with a past psychiatric history of bipolar 2 disorder, MDD, anxiety, ADHD, PTSD, insomnia who presented voluntarily and accompanied by her step-father to [facility] for a walk-in assessment for suicidal ideations and worsening depression. Patient preferred to complete assessment without step-father present in room.   Patient assessed face-to-face by this provider and chart reviewed on 03/29/23. On evaluation, Chelsea Vargas is seated in assessment area in no acute distress. Patient is alert and oriented x4, pleasant and cooperative. Speech is clear and coherent, normal rate and volume. Eye contact is fair. Mood is depressed and anxious with tearful affect. Thought process is coherent with logical thought content. Patient reports active suicidal ideations with a plan to "slit my wrists." Patient states she's dealt with depression "for a long time" and she is unable to verbally contract for safety at this time. Patient denies homicidal ideations. Patient reports a past suicide attempt 2 Saturdays ago where she "was in the bathroom, laid out pics of my family, filled the bathtub with water, and was going to cut my wrists but my cousin kicked in the door and stopped me." Patient reports a history of self-harm by burning her left wrist by using a "lighter to heat up a piece of metal" for the past 2 weeks. Patient denies past psychiatric hospitalizations. Patient denies current auditory and visual hallucinations but states she has "seen shadows" for the past year. Patient reports occasional symptoms of paranoia "like someone's out  to get me, sometimes when walk outside I feel like someone is watching me." Patient is able to converse coherently with goal-directed thoughts and no distractibility and preoccupation. Objectively, there is no evidence of psychosis/mania, delusional thinking, or indication that patient is responding to internal or external stimuli.  Patient reports poor sleep (2 hours/night) and poor appetite, stating she hasn't eaten in 5 days. Patient lives in [place] with her mother, step-father, and has her son every 2 weeks. Patient states her step-father has unsecured firearms in the home that she could access if she wanted to. Patient is employed at [employer] and [employer]. Patient endorses marijuana use by smoking 2 grams daily. Patient denies use of alcohol or other illicit substances. Patient states she currently receives therapy and medication management at [facility] in [place]. Patient states "I was at [facility] crisis center today and told them how I was feeling. I had a panic attack there and they called the ambulance. They said I had to stay there or go to the hospital." Patient states she did not want to go to the hospital, so they instructed her to come to [facility]. Patient provided verbal consent for this provider to contact [facility] in [place] to obtain her current list of medications.  Spoke with Grayling Congress, crisis counselor at [facility] in [place] who states patient is currently prescribed: Celexa 40mg  daily, oxcarbazepine 300mg  BID, hydroxyzine 50mg  BID PRN, and venlafaxine XR 150mg  daily. Patient states she is compliant with these medications but has not taken her medications yet today.  Patient offered support and encouragement. Discussed recommendations for inpatient psychiatric treatment. Discussed admission to the continuous observation unit while awaiting inpatient psychiatric placement.  Patient is in agreement with plan of care.  Total Time spent with patient: 45 minutes  Musculoskeletal  Strength & Muscle  Tone: within normal limits Gait & Station: normal Patient leans: N/A  Psychiatric Specialty Exam  Presentation General Appearance:  Appropriate for Environment; Casual  Eye Contact: Fair  Speech: Clear and Coherent; Normal Rate  Speech Volume: Normal  Handedness: Right   Mood and Affect  Mood: Depressed; Anxious  Affect: Congruent; Tearful; Depressed   Thought Process  Thought Processes: Coherent; Goal Directed  Descriptions of Associations:Intact  Orientation:Full (Time, Place and Person)  Thought Content:Logical  Diagnosis of Schizophrenia or Schizoaffective disorder in past: No   Hallucinations:Hallucinations: None  Ideas of Reference:None  Suicidal Thoughts:Suicidal Thoughts: Yes, Active SI Active Intent and/or Plan: With Intent; With Plan; With Means to Carry Out; With Access to Means  Homicidal Thoughts:Homicidal Thoughts: No   Sensorium  Memory: Immediate Fair; Recent Fair; Remote Fair  Judgment: Poor  Insight: Fair   Chartered certified accountant: Fair  Attention Span: Fair  Recall: [employer] of Knowledge: Fair  Language: Fair   Psychomotor Activity  Psychomotor Activity: Psychomotor Activity: Normal   Assets  Assets: Manufacturing systems engineer; Desire for Improvement; Financial Resources/Insurance; Housing; Physical Health; Resilience; Social Support; Transportation   Sleep  Sleep: Sleep: Poor Number of Hours of Sleep: 2   Nutritional Assessment (For OBS and FBC admissions only) Has the patient had a weight loss or gain of 10 pounds or more in the last 3 months?: No Has the patient had a decrease in food intake/or appetite?: No Does the patient have dental problems?: No Does the patient have eating habits or behaviors that may be indicators of an eating disorder including binging or inducing vomiting?: No Has the patient recently lost weight without trying?: 0 Has the patient been eating poorly because of a  decreased appetite?: 0 Malnutrition Screening Tool Score: 0    Physical Exam Vitals and nursing note reviewed.  Constitutional:      General: She is not in acute distress.    Appearance: Normal appearance. She is not ill-appearing.  HENT:     Head: Normocephalic and atraumatic.     Nose: Nose normal.  Eyes:     General:        Right eye: No discharge.        Left eye: No discharge.     Conjunctiva/sclera: Conjunctivae normal.  Cardiovascular:     Rate and Rhythm: Normal rate.  Pulmonary:     Effort: Pulmonary effort is normal. No respiratory distress.  Musculoskeletal:        General: Normal range of motion.     Cervical back: Normal range of motion.  Skin:    General: Skin is warm and dry.  Neurological:     General: No focal deficit present.     Mental Status: She is alert and oriented to person, place, and time. Mental status is at baseline.  Psychiatric:        Attention and Perception: Attention and perception normal.        Mood and Affect: Mood is anxious and depressed. Affect is tearful.        Speech: Speech normal.        Behavior: Behavior normal. Behavior is cooperative.        Thought Content: Thought content is not paranoid or delusional. Thought content includes suicidal ideation. Thought content does not include homicidal ideation. Thought content includes suicidal plan. Thought content does not  include homicidal plan.        Cognition and Memory: Cognition and memory normal.        Judgment: Judgment normal.    Review of Systems  Constitutional: Negative.   HENT: Negative.    Eyes: Negative.   Respiratory: Negative.    Cardiovascular: Negative.   Gastrointestinal: Negative.   Genitourinary: Negative.   Musculoskeletal: Negative.   Skin: Negative.   Neurological: Negative.   Endo/Heme/Allergies: Negative.   Psychiatric/Behavioral:  Positive for depression and suicidal ideas. Negative for hallucinations and memory loss. The patient is nervous/anxious  and has insomnia.     Blood pressure 114/71, pulse 64, temperature 98 F (36.7 C), temperature source Oral, resp. rate 18, last menstrual period 02/25/2023, SpO2 100 %. There is no height or weight on file to calculate BMI.  Past Psychiatric History: Bipolar 2 disorder, MDD, anxiety, ADHD, PTSD, insomnia  Is the patient at risk to self? Yes  Has the patient been a risk to self in the past 6 months? No .    Has the patient been a risk to self within the distant past? No   Is the patient a risk to others? No   Has the patient been a risk to others in the past 6 months? No   Has the patient been a risk to others within the distant past? No   Past Medical History:  Past Medical History:  Diagnosis Date   Acne    iPledge #: [phone]   Anxiety    Bipolar disorder (HCC)    Depression    Family history of breast cancer    5/22 Genetic testing letter sent   Mental disorder    Bipolar   Family History:  Family History  Problem Relation Age of Onset   Breast cancer Paternal Grandmother    Breast cancer Maternal Aunt    Breast cancer Maternal Aunt    Breast cancer Other    Breast cancer Other 82   Rheum arthritis Mother    Cirrhosis Mother        non alcoholic   Diabetes Mother    Cancer Father    Social History:  Social History   Tobacco Use   Smoking status: Never   Smokeless tobacco: Never  Vaping Use   Vaping Use: Every day   Substances: Nicotine  Substance Use Topics   Alcohol use: Yes    Comment: occassional   Drug use: No   Last Labs:  No visits with results within 6 Month(s) from this visit.  Latest known visit with results is:  Office Visit on 06/29/2021  Component Date Value Ref Range Status   Preg Test, Ur 06/29/2021 Negative  Negative Final   Allergies: Semaglutide  Medications:  Facility Ordered Medications  Medication   acetaminophen (TYLENOL) tablet 650 mg   alum & mag hydroxide-simeth (MAALOX/MYLANTA) 200-200-20 MG/5ML suspension 30 mL    magnesium hydroxide (MILK OF MAGNESIA) suspension 30 mL   hydrOXYzine (ATARAX) tablet 25 mg   traZODone (DESYREL) tablet 50 mg   citalopram (CELEXA) tablet 40 mg   [START ON 03/30/2023] venlafaxine XR (EFFEXOR-XR) 24 hr capsule 150 mg   Oxcarbazepine (TRILEPTAL) tablet 300 mg   PTA Medications  Medication Sig   hydrOXYzine (VISTARIL) 50 MG capsule Take 1 capsule (50 mg total) by mouth 2 (two) times daily as needed for anxiety.   citalopram (CELEXA) 40 MG tablet Take 1 tablet (40 mg total) by mouth daily.   venlafaxine XR (EFFEXOR XR) 75  MG 24 hr capsule Take 1 capsule (75 mg total) by mouth daily.   ondansetron (ZOFRAN-ODT) 8 MG disintegrating tablet Take 1 tablet (8 mg total) by mouth every 8 (eight) hours as needed for nausea or vomiting.      Medical Decision Making  Chelsea Vargas was admitted to [facility] continuous assessment unit for suicidal ideation, MDD (major depressive disorder), recurrent episode, severe (HCC), crisis management, and stabilization. Routine labs ordered, which include Lab Orders         CBC with Differential/Platelet         Comprehensive metabolic panel         Hemoglobin A1c         Magnesium         TSH         Prolactin         Ethanol         Lipid panel         POC urine preg, ED         POCT Urine Drug Screen - (I-Screen)    Medication Management: Medications started, restart home medications as appropriate Meds ordered this encounter  Medications   acetaminophen (TYLENOL) tablet 650 mg   alum & mag hydroxide-simeth (MAALOX/MYLANTA) 200-200-20 MG/5ML suspension 30 mL   magnesium hydroxide (MILK OF MAGNESIA) suspension 30 mL   hydrOXYzine (ATARAX) tablet 25 mg   traZODone (DESYREL) tablet 50 mg   citalopram (CELEXA) tablet 40 mg   venlafaxine XR (EFFEXOR-XR) 24 hr capsule 150 mg   Oxcarbazepine (TRILEPTAL) tablet 300 mg    Will maintain observation checks every 15 minutes for safety.   Recommendations   Based on my evaluation the patient does not appear to have an emergency medical condition.  Recommend inpatient psychiatric treatment. Patient has been tentatively accepted to [facility] by [facility] Malva Limes, RN pending lab work results. Patient arrive next shift (03/29/23) after 8pm. RN TO RN report [phone], Fax vol consent to [phone]. DX MDD. Attending MD [place].  Sunday Corn, NP 03/29/23  3:56 PM
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
## 2023-03-30 ENCOUNTER — Inpatient Hospital Stay (HOSPITAL_COMMUNITY)
Admission: AD | Admit: 2023-03-30 | Discharge: 2023-04-04 | DRG: 885 | Disposition: A | Discharge status: Home / Self Care | Payer: Commercial Managed Care - PPO | Source: Intra-hospital | Attending: Psychiatry | Admitting: Psychiatry

## 2023-03-30 ENCOUNTER — Other Ambulatory Visit: Payer: Self-pay

## 2023-03-30 ENCOUNTER — Encounter (HOSPITAL_COMMUNITY): Payer: Self-pay | Admitting: Behavioral Health

## 2023-03-30 DIAGNOSIS — F4310 Post-traumatic stress disorder, unspecified: Secondary | ICD-10-CM | POA: Diagnosis present

## 2023-03-30 DIAGNOSIS — G4700 Insomnia, unspecified: Secondary | ICD-10-CM | POA: Diagnosis not present

## 2023-03-30 DIAGNOSIS — K5900 Constipation, unspecified: Secondary | ICD-10-CM | POA: Diagnosis not present

## 2023-03-30 DIAGNOSIS — D509 Iron deficiency anemia, unspecified: Secondary | ICD-10-CM | POA: Diagnosis not present

## 2023-03-30 DIAGNOSIS — R630 Anorexia: Secondary | ICD-10-CM | POA: Diagnosis not present

## 2023-03-30 DIAGNOSIS — F515 Nightmare disorder: Secondary | ICD-10-CM | POA: Diagnosis not present

## 2023-03-30 DIAGNOSIS — F332 Major depressive disorder, recurrent severe without psychotic features: Secondary | ICD-10-CM | POA: Diagnosis not present

## 2023-03-30 DIAGNOSIS — R45851 Suicidal ideations: Secondary | ICD-10-CM | POA: Diagnosis present

## 2023-03-30 DIAGNOSIS — F3181 Bipolar II disorder: Secondary | ICD-10-CM | POA: Diagnosis not present

## 2023-03-30 DIAGNOSIS — F411 Generalized anxiety disorder: Secondary | ICD-10-CM | POA: Diagnosis present

## 2023-03-30 LAB — POCT PREGNANCY, URINE: Preg Test, Ur: NEGATIVE

## 2023-03-30 LAB — PROLACTIN: Prolactin: 4.3 ng/mL — ABNORMAL LOW (ref 4.8–33.4)

## 2023-03-30 MED ORDER — ALUM & MAG HYDROXIDE-SIMETH 200-200-20 MG/5ML PO SUSP: 30.0000 mL | ORAL | Status: DC | PRN

## 2023-03-30 MED ORDER — HYDROXYZINE HCL 25 MG PO TABS
25.0000 mg | ORAL_TABLET | Freq: Three times a day (TID) | ORAL | Status: DC | PRN
  Administered 2023-03-30 – 2023-04-02 (×5): 25 mg via ORAL
  Filled 2023-03-30 (×3): qty 1

## 2023-03-30 MED ORDER — TRAZODONE HCL 50 MG PO TABS
50.0000 mg | ORAL_TABLET | Freq: Every evening | ORAL | Status: DC | PRN
  Administered 2023-03-31: 50 mg via ORAL
  Filled 2023-03-30 (×2): qty 1

## 2023-03-30 MED ORDER — ACETAMINOPHEN 325 MG PO TABS: 650.0000 mg | ORAL_TABLET | Freq: Four times a day (QID) | ORAL | Status: DC | PRN

## 2023-03-30 MED ORDER — LORAZEPAM 1 MG PO TABS: 1.0000 mg | ORAL_TABLET | ORAL | Status: DC | PRN

## 2023-03-30 MED ORDER — ZIPRASIDONE HCL 20 MG PO CAPS: 20.0000 mg | ORAL_CAPSULE | Freq: Once | ORAL | Status: DC | PRN

## 2023-03-30 MED ORDER — OXCARBAZEPINE 300 MG PO TABS
300.0000 mg | ORAL_TABLET | Freq: Two times a day (BID) | ORAL | Status: DC
  Administered 2023-03-30 – 2023-03-31 (×2): 300 mg via ORAL
  Filled 2023-03-30 (×6): qty 1

## 2023-03-30 MED ORDER — MAGNESIUM HYDROXIDE 400 MG/5ML PO SUSP: 30.0000 mL | Freq: Every day | ORAL | Status: DC | PRN

## 2023-03-30 MED ORDER — NICOTINE POLACRILEX 2 MG MT GUM: 2.0000 mg | CHEWING_GUM | OROMUCOSAL | Status: DC | PRN

## 2023-03-30 MED ORDER — ZIPRASIDONE MESYLATE 20 MG IM SOLR: 20.0000 mg | Freq: Two times a day (BID) | INTRAMUSCULAR | Status: DC | PRN

## 2023-03-30 MED ORDER — CITALOPRAM HYDROBROMIDE 40 MG PO TABS
40.0000 mg | ORAL_TABLET | Freq: Every day | ORAL | Status: DC
  Administered 2023-03-31: 40 mg via ORAL
  Filled 2023-03-30 (×2): qty 1

## 2023-03-30 MED ORDER — VENLAFAXINE HCL ER 150 MG PO CP24
150.0000 mg | ORAL_CAPSULE | Freq: Every day | ORAL | Status: DC
  Administered 2023-03-31: 150 mg via ORAL
  Filled 2023-03-30 (×3): qty 1

## 2023-03-30 NOTE — Progress Notes (Signed)
   03/30/23 2244  Psych Admission Type (Psych Patients Only)  Admission Status Voluntary  Psychosocial Assessment  Patient Complaints Anxiety;Agitation;Crying spells;Depression  Eye Contact Brief  Facial Expression Animated;Anxious  Affect Apprehensive;Appropriate to circumstance;Anxious  Speech Logical/coherent  Interaction Assertive  Motor Activity Other (Comment) (WDL)  Appearance/Hygiene Disheveled  Behavior Characteristics Appropriate to situation  Mood Anxious;Preoccupied  Thought Process  Coherency WDL  Content WDL  Delusions None reported or observed  Perception WDL  Hallucination None reported or observed  Judgment Poor  Confusion None  Danger to Self  Current suicidal ideation? Passive  Description of Suicide Plan no plan  Self-Injurious Behavior No self-injurious ideation or behavior indicators observed or expressed   Agreement Not to Harm Self Yes  Description of Agreement verbal  Danger to Others  Danger to Others None reported or observed

## 2023-03-30 NOTE — Plan of Care (Signed)
Nurse discussed anxiety, depression and coping skills with patient.  

## 2023-03-30 NOTE — ED Notes (Signed)
Received Chelsea Vargas this Am asleep in her chair bed, she woke up, made several calls then became tearful. She endorsed feeling anxious and depressed. She was medicated p[er order and reassured she was safe here on the unit.
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
## 2023-03-30 NOTE — [facility] Group Notes (Signed)
Adult Psychoeducational Group Note  Date:  03/30/2023 Time:  9:41 PM  Group Topic/Focus:  Recovery Goals:   The focus of this group is to identify appropriate goals for recovery and establish a plan to achieve them.  Participation Level:  Did Not Attend  Participation Quality:    Affect:    Cognitive:    Insight:   Engagement in Group:    Modes of Intervention:    Additional Comments:     Lenda Baratta A Adeliz Tonkinson-McCall 03/30/2023, 9:41 PM

## 2023-03-30 NOTE — Tx Team (Signed)
Initial Treatment Plan 03/30/2023 6:23 PM Chelsea Vargas [id number]    PATIENT STRESSORS: Financial difficulties   Marital or family conflict   Occupational concerns   Substance abuse     PATIENT STRENGTHS: Average or above average intelligence  Capable of independent living  Communication skills  General fund of knowledge  Motivation for treatment/growth  Physical Health  Supportive family/friends  Work skills    PATIENT IDENTIFIED PROBLEMS: "Anxiety"  "Depression"  "Substance abuse"  "Suicidal thoughts"               DISCHARGE CRITERIA:  Ability to meet basic life and health needs Adequate post-discharge living arrangements Improved stabilization in mood, thinking, and/or behavior Motivation to continue treatment in a less acute level of care Need for constant or close observation no longer present Reduction of life-threatening or endangering symptoms to within safe limits Safe-care adequate arrangements made Verbal commitment to aftercare and medication compliance Withdrawal symptoms are absent or subacute and managed without 24-hour nursing intervention  PRELIMINARY DISCHARGE PLAN: Attend aftercare/continuing care group Attend PHP/IOP Attend 12-step recovery group Outpatient therapy Return to previous living arrangement Return to previous work or school arrangements  PATIENT/FAMILY INVOLVEMENT: This treatment plan has been presented to and reviewed with the patient, Chelsea Vargas..  The patient and family have been given the opportunity to ask questions and make suggestions.  [name], [place] 03/30/2023, 6:23 PM

## 2023-03-31 LAB — BASIC METABOLIC PANEL
Anion gap: 13 (ref 5–15)
BUN: 12 mg/dL (ref 6–20)
CO2: 22 mmol/L (ref 22–32)
Calcium: 9.1 mg/dL (ref 8.9–10.3)
Chloride: 100 mmol/L (ref 98–111)
Creatinine, Ser: 0.69 mg/dL (ref 0.44–1.00)
GFR, Estimated: >60 mL/min (ref >60)
Glucose, Bld: 110 mg/dL — ABNORMAL HIGH (ref 70–99)
Potassium: 3.1 mmol/L — ABNORMAL LOW (ref 3.5–5.1)
Sodium: 135 mmol/L (ref 135–145)

## 2023-03-31 LAB — RETICULOCYTES
Immature Retic Fract: 12 % (ref 2.3–15.9)
RBC.: 4.82 MIL/uL (ref 3.87–5.11)
Retic Count, Absolute: 66 10*3/uL (ref 19.0–186.0)
Retic Ct Pct: 1.4 % (ref 0.4–3.1)

## 2023-03-31 LAB — IRON AND TIBC
Iron: 53 ug/dL (ref 28–170)
Saturation Ratios: 12 % (ref 10.4–31.8)
TIBC: 431 ug/dL (ref 250–450)
UIBC: 378 ug/dL

## 2023-03-31 LAB — VITAMIN B12: Vitamin B-12: 308 pg/mL (ref 180–914)

## 2023-03-31 LAB — FERRITIN: Ferritin: 58 ng/mL (ref 11–307)

## 2023-03-31 MED ORDER — VENLAFAXINE HCL ER 75 MG PO CP24
225.0000 mg | ORAL_CAPSULE | Freq: Every day | ORAL | Status: DC
  Administered 2023-04-01 – 2023-04-04 (×4): 225 mg via ORAL
  Filled 2023-03-31 (×6): qty 3

## 2023-03-31 MED ORDER — ENSURE ENLIVE PO LIQD
237.0000 mL | Freq: Two times a day (BID) | ORAL | Status: DC
  Administered 2023-03-31 – 2023-04-04 (×3): 237 mL via ORAL
  Filled 2023-03-31 (×13): qty 237

## 2023-03-31 MED ORDER — OXCARBAZEPINE 300 MG PO TABS
300.0000 mg | ORAL_TABLET | Freq: Two times a day (BID) | ORAL | Status: DC
  Administered 2023-03-31 – 2023-04-04 (×8): 300 mg via ORAL
  Filled 2023-03-31 (×12): qty 1

## 2023-03-31 MED ORDER — NICOTINE 14 MG/24HR TD PT24
14.0000 mg | MEDICATED_PATCH | Freq: Every day | TRANSDERMAL | Status: DC
  Administered 2023-03-31 – 2023-04-01 (×2): 14 mg via TRANSDERMAL
  Filled 2023-03-31 (×7): qty 1

## 2023-03-31 MED ORDER — PRAZOSIN HCL 1 MG PO CAPS
1.0000 mg | ORAL_CAPSULE | Freq: Every day | ORAL | Status: DC
  Administered 2023-03-31: 1 mg via ORAL
  Filled 2023-03-31 (×2): qty 1

## 2023-03-31 MED ORDER — POTASSIUM CHLORIDE CRYS ER 20 MEQ PO TBCR
40.0000 meq | EXTENDED_RELEASE_TABLET | Freq: Two times a day (BID) | ORAL | Status: AC
  Administered 2023-04-01 (×2): 40 meq via ORAL
  Filled 2023-03-31 (×2): qty 2
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
## 2023-03-31 NOTE — Progress Notes (Signed)
Patient appears depressed. Patient denies HI/AVH. Pt reports anxiety is 10/10 and depression is 10/10. Pt reports "terrible" sleep and no appetite for the past 5 days. Patient complied with morning medication with no reported side effects. Pt given PRN hydroxyzine for anxiety. Pt was crying in her room and stating "I can't do this, the pain is too much. Please kill me, I'm so scared." Pt was provided emotional support and was told that she is safe. Hydroxyzine was effective and pt was attempting to sleep. Patient remains safe on Q40min checks and contracts for safety.      03/31/23 0841  Psych Admission Type (Psych Patients Only)  Admission Status Voluntary  Psychosocial Assessment  Patient Complaints Anxiety;Crying spells;Depression;Sleep disturbance  Eye Contact Brief  Facial Expression Sad  Affect Anxious;Sad;Depressed  Speech Logical/coherent;Soft  Interaction Cautious  Appearance/Hygiene Disheveled  Behavior Characteristics Cooperative;Anxious  Mood Anxious;Depressed  Thought Process  Coherency WDL  Content WDL  Delusions None reported or observed  Perception WDL  Hallucination None reported or observed  Judgment Poor  Confusion None  Danger to Self  Current suicidal ideation? Passive  Self-Injurious Behavior No self-injurious ideation or behavior indicators observed or expressed   Agreement Not to Harm Self Yes  Description of Agreement verbal  Danger to Others  Danger to Others None reported or observed
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
## 2023-03-31 NOTE — [facility] Group Notes (Signed)
Adult Psychoeducational Group Note  Date:  03/31/2023 Time:  9:38 AM  Group Topic/Focus:  Orientation:   The focus of this group is to educate the patient on the purpose and policies of crisis stabilization and provide a format to answer questions about their admission.  The group details unit policies and expectations of patients while admitted.  Participation Level:  Did Not Attend  Participation Quality:    Affect:    Cognitive:    Insight:   Engagement in Group:    Modes of Intervention:    Additional Comments:    Azusena Erlandson A Myla Mauriello-McCall 03/31/2023, 9:38 AM

## 2023-03-31 NOTE — [facility] Group Notes (Signed)
Adult Psychoeducational Group Note  Date:  03/31/2023 Time:  2:29 PM  Group Topic/Focus:  Emotional Education:   The focus of this group is to discuss what feelings/emotions are, and how they are experienced.  The group focused on the process of reframing thought process from negative to positive.  Participation Level:  Did Not Attend  Participation Quality:    Affect:    Cognitive:    Insight:   Engagement in Group:    Modes of Intervention:    Additional Comments:    Bryant Lipps A Sirron Francesconi-McCall 03/31/2023, 2:29 PM

## 2023-03-31 NOTE — Progress Notes (Signed)
   03/31/23 2241  Psych Admission Type (Psych Patients Only)  Admission Status Voluntary  Psychosocial Assessment  Patient Complaints Anxiety  Eye Contact Brief  Facial Expression Sad  Affect Appropriate to circumstance;Depressed  Speech Logical/coherent  Interaction Guarded  Motor Activity Other (Comment) (WDL)  Appearance/Hygiene Unremarkable  Behavior Characteristics Appropriate to situation  Mood Depressed;Pleasant  Thought Process  Coherency WDL  Content WDL  Delusions None reported or observed  Perception WDL  Hallucination None reported or observed  Judgment Poor  Confusion None  Danger to Self  Current suicidal ideation? Denies  Self-Injurious Behavior No self-injurious ideation or behavior indicators observed or expressed   Agreement Not to Harm Self Yes  Description of Agreement verbal  Danger to Others  Danger to Others None reported or observed

## 2023-03-31 NOTE — Plan of Care (Signed)
  Problem: Education: Goal: Ability to state activities that reduce stress will improve Outcome: Progressing   Problem: Education: Goal: Utilization of techniques to improve thought processes will improve Outcome: Progressing Goal: Knowledge of the prescribed therapeutic regimen will improve Outcome: Progressing   Problem: Education: Goal: Knowledge of [facility] Education information/materials will improve Outcome: Progressing   Problem: Education: Goal: Knowledge of disease or condition will improve Outcome: Progressing Goal: Understanding of discharge needs will improve Outcome: Progressing   Problem: Education: Goal: Ability to make informed decisions regarding treatment will improve Outcome: Progressing

## 2023-03-31 NOTE — B[facility] Group Notes (Signed)
[facility] Group Notes:  (Nursing/MHT/Case Management/Adjunct)  Date:  03/31/2023  Time:  9:12 PM  Type of Therapy:   Wrap-up group  Participation Level:  Active  Participation Quality:  Appropriate  Affect:  Appropriate  Cognitive:  Appropriate  Insight:  Appropriate  Engagement in Group:  Engaged  Modes of Intervention:  Education  Summary of Progress/Problems: Goal  to eat, pt reports she did eat dinner. Day was 7/10. Participated in an emotional skills exercise.   Noah Delaine 03/31/2023, 9:12 PM
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
## 2023-04-01 MED ORDER — TRAZODONE HCL 50 MG PO TABS
50.0000 mg | ORAL_TABLET | Freq: Every evening | ORAL | Status: DC | PRN
  Administered 2023-04-01 – 2023-04-02 (×2): 50 mg via ORAL
  Filled 2023-04-01 (×2): qty 1

## 2023-04-01 MED ORDER — PRAZOSIN HCL 2 MG PO CAPS
2.0000 mg | ORAL_CAPSULE | Freq: Every day | ORAL | Status: DC
  Administered 2023-04-01 – 2023-04-03 (×3): 2 mg via ORAL
  Filled 2023-04-01 (×5): qty 1

## 2023-04-01 NOTE — Progress Notes (Addendum)
D. Pt presented with an improved mood today- reported that she was still experiencing anxiety in the morning but stated, "it is better than yesterday morning." Per pt's self inventory, pt rated her depression,hopelessness and anxiety a 6/6/8, respectively. Pt's stated goal today is "To attend more groups and try to socialize more." Pt currently denies SI/HI and AVH and does not appear to be responding to internal stimuli.   A. Labs and vitals monitored. Pt given and educated on medications. Pt supported emotionally and encouraged to express concerns and ask questions.   R. Pt remains safe with 15 minute checks. Will continue POC.    04/01/23 1100  Psych Admission Type (Psych Patients Only)  Admission Status Voluntary  Psychosocial Assessment  Patient Complaints Anxiety  Eye Contact Brief  Facial Expression Anxious  Affect Appropriate to circumstance  Speech Logical/coherent  Interaction Assertive  Motor Activity Other (Comment) (steady gait)  Appearance/Hygiene Unremarkable  Behavior Characteristics Cooperative;Appropriate to situation  Mood Anxious;Pleasant  Thought Process  Coherency WDL  Content WDL  Delusions None reported or observed  Perception WDL  Hallucination None reported or observed  Judgment Poor  Confusion None  Danger to Self  Current suicidal ideation? Denies  Self-Injurious Behavior No self-injurious ideation or behavior indicators observed or expressed   Agreement Not to Harm Self Yes  Description of Agreement verbal contract for safety  Danger to Others  Danger to Others None reported or observed
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
## 2023-04-01 NOTE — Progress Notes (Signed)
   04/01/23 2149  Psych Admission Type (Psych Patients Only)  Admission Status Voluntary  Psychosocial Assessment  Patient Complaints Anxiety  Eye Contact Brief  Facial Expression Animated  Affect Appropriate to circumstance  Speech Logical/coherent  Interaction Assertive  Motor Activity Other (Comment) (WDL)  Appearance/Hygiene Unremarkable  Behavior Characteristics Appropriate to situation  Mood Anxious;Pleasant  Thought Process  Coherency WDL  Content WDL  Delusions None reported or observed  Perception WDL  Hallucination None reported or observed  Judgment Poor  Confusion None  Danger to Self  Current suicidal ideation? Denies  Self-Injurious Behavior No self-injurious ideation or behavior indicators observed or expressed   Agreement Not to Harm Self Yes  Description of Agreement verbal  Danger to Others  Danger to Others None reported or observed

## 2023-04-01 NOTE — Group Note (Signed)
Date:  04/01/2023 Time:  11:40 AM  Group Topic/Focus:  Goals Group:   The focus of this group is to help patients establish daily goals to achieve during treatment and discuss how the patient can incorporate goal setting into their daily lives to aide in recovery. Orientation:   The focus of this group is to educate the patient on the purpose and policies of crisis stabilization and provide a format to answer questions about their admission.  The group details unit policies and expectations of patients while admitted.    Participation Level:  Active  Participation Quality:  Appropriate  Affect:  Appropriate  Cognitive:  Appropriate  Insight: Appropriate  Engagement in Group:  Engaged  Modes of Intervention:  Activity  Additional Comments:   Pt attended and participated in the Orientation/Goals group. Pt completed the goals activity. Pt goal is to attend more groups. Pt plans to push herself more, remind herself that she's safe to help her reach her goal.  Stark Bray 04/01/2023, 11:40 AM

## 2023-04-01 NOTE — Group Note (Signed)
Date:  04/01/2023 Time:  6:33 PM  Group Topic/Focus:  Dimensions of Wellness:   The focus of this group is to introduce the topic of wellness and discuss the role each dimension of wellness plays in total health.    Participation Level:  Did Not Attend  Participation Quality:   n/a  Affect:   n/a  Cognitive:   n/a  Insight: None  Engagement in Group:   n/a  Modes of Intervention:   n/a  Additional Comments:   Pt did not attend.  Edmund Hilda Aryianna Earwood 04/01/2023, 6:33 PM

## 2023-04-01 NOTE — Group Note (Signed)
Date:  04/01/2023 Time:  6:56 PM  Group Topic/Focus:  Dimensions of Wellness:   The focus of this group is to introduce the topic of wellness and discuss the role each dimension of wellness plays in total health.    Participation Level:  Active  Participation Quality:  Appropriate  Affect:  Appropriate  Cognitive:  Appropriate  Insight: Appropriate  Engagement in Group:  Engaged  Modes of Intervention:  Activity and Discussion  Additional Comments:   Pt attended and participated in the Spiritual Wellness group.  Chelsea Vargas M Pheng Prokop 04/01/2023, 6:56 PM  

## 2023-04-01 NOTE — Group Note (Signed)
Date:  04/01/2023 Time:  4:36 PM  Group Topic/Focus:  Dimensions of Wellness:   The focus of this group is to introduce the topic of wellness and discuss the role each dimension of wellness plays in total health.    Participation Level:  Did Not Attend  Participation Quality:   n/a  Affect:   n/a  Cognitive:   n/a  Insight: None  Engagement in Group:   n/a  Modes of Intervention:   n/a  Additional Comments:   Pt did not attend.  Chelsea Vargas 04/01/2023, 4:36 PM

## 2023-04-01 NOTE — Progress Notes (Signed)
[facility] MD Progress Note  04/01/2023 8:52 AM Chelsea Vargas  MRN:  [id number] Principal Problem: MDD (major depressive disorder), recurrent severe, without psychosis (HCC) Diagnosis: Principal Problem:   MDD (major depressive disorder), recurrent severe, without psychosis (HCC)   Reason for Admission:  Chelsea Vargas is a 27 year old female with past psychiatric history of bipolar 2 disorder, MDD, anxiety, ADHD, PTSD, insomnia presenting voluntarily with stepfather initially evaluated at [facility] 03/29/23 and admitted to [facility] for worsening suicidal ideation and worsening depression. This is hospitalization day 2.  Subjective:   Patient seen and assessed in milieu. Reports depression still severe but improving. Anxiety still severe stating she woke up this morning feeling anxious and vomited this morning due to it. She states sleep improving but she woke up multiple times last night due to anxiety and nightmares.  Mom visited yesterday which went well. Her ex boyfriend is visiting today due to him persistently calling. I advised she needed to consider if this would be beneficial to her or more harmful. She states she will consider this and possibly have mom visit instead. She is agreeable to increase in prazosin and increase in effexor today. She stated she was previously on Effexor 225 mg but was being tapered off of it because mom did not want her to be on it.  She denies any somatic complaints from initiation of prazosin. Denies alcohol withdrawal symptoms  She endorses passive suicidal ideation but is able to contract for safety.  Denies HI/AVH.  Objective:  Chart Review Past 24 hours of patient's chart was reviewed.  Patient is compliant with scheduled meds. Required Agitation PRNs: none Per RN notes, no documented behavioral issues and is attending group.  Total Time spent with patient: 30 minutes  Past Psychiatric History:  Previous Psych Diagnoses: PTSD, depression, anxiety, insomnia,  Prior  inpatient treatment:  Current/prior outpatient treatment: Started medications approximately 6 years ago after patient gave birth via C-section for postpartum depression.  Only recently was transitioned over to [facility]. Psychotherapy hx: Has only recently started [facility] therapy but reports that the therapist is nice.  She had at the former therapist that she did not feel compatible with and only went to briefly. History of suicide: Never attempted suicide outside of 2 weeks ago SIB: burned herself since a drug 17.  Will occasionally stop but then relapse due to anxiety or severe depression History of homicide: Denies Psychiatric medication history:abilify (akathisia), zoloft (ineffective), zyprexa, seroquel (sleepy), "other mood stabilizers, antipsychotics, and antidepressants that I can't remember" Psychiatric medication compliance history:good Neuromodulation history: denies Current Psychiatrist: formally Dr. Maryruth Bun, now see someone at [facility] Current therapist: [facility] therapist  Past Medical History:  Past Medical History:  Diagnosis Date   Acne    iPledge #: [phone]   Anxiety    Bipolar disorder (HCC)    Depression    Family history of breast cancer    5/22 Genetic testing letter sent   Mental disorder    Bipolar    Past Surgical History:  Procedure Laterality Date   CESAREAN SECTION     TONSILLECTOMY     Family History:  Family History  Problem Relation Age of Onset   Breast cancer Paternal Grandmother    Breast cancer Maternal Aunt    Breast cancer Maternal Aunt    Breast cancer Other    Breast cancer Other 69   Rheum arthritis Mother    Cirrhosis Mother        non alcoholic   Diabetes Mother  Cancer Father    Family Psychiatric  History:  Father: bipolar disorder Uncle: bipolar disorder Mother: severe anxiety  Social History:  Social History   Substance and Sexual Activity  Alcohol Use Yes   Alcohol/week: 1.0 standard drink of alcohol   Types: 1 Standard drinks or  equivalent per week   Comment: occassional     Social History   Substance and Sexual Activity  Drug Use Yes   Frequency: 7.0 times per week   Types: Marijuana    Social History   Socioeconomic History   Marital status: Single    Spouse name: Not on file   Number of children: 1   Years of education: 12   Highest education level: 12th grade  Occupational History   Occupation: school  Tobacco Use   Smoking status: Never    Passive exposure: Never   Smokeless tobacco: Never   Tobacco comments:    THC since 27 yrs old, once daily, vapes  Vaping Use   Vaping Use: Every day   Start date: 03/29/2021  Substance and Sexual Activity   Alcohol use: Yes    Alcohol/week: 1.0 standard drink of alcohol    Types: 1 Standard drinks or equivalent per week    Comment: occassional   Drug use: Yes    Frequency: 7.0 times per week    Types: Marijuana   Sexual activity: Yes    Birth control/protection: None, Condom  Other Topics Concern   Not on file  Social History Narrative   Not on file   Social Determinants of Health   Financial Resource Strain: Not on file  Food Insecurity: No Food Insecurity (03/30/2023)   Hunger Vital Sign    Worried About Running Out of Food in the Last Year: Never true    Ran Out of Food in the Last Year: Never true  Transportation Needs: No Transportation Needs (03/30/2023)   PRAPARE - Administrator, Civil Service (Medical): No    Lack of Transportation (Non-Medical): No  Physical Activity: Not on file  Stress: Not on file  Social Connections: Not on file   Additional Social History:                         Current Medications: Current Facility-Administered Medications  Medication Dose Route Frequency Provider Last Rate Last Admin   acetaminophen (TYLENOL) tablet 650 mg  650 mg Oral Q6H PRN Sunday Corn, NP       alum & mag hydroxide-simeth (MAALOX/MYLANTA) 200-200-20 MG/5ML suspension 30 mL  30 mL Oral Q4H PRN Sunday Corn, NP       feeding supplement (ENSURE ENLIVE / ENSURE PLUS) liquid 237 mL  237 mL Oral BID BM Park Pope, MD   237 mL at 03/31/23 1230   hydrOXYzine (ATARAX) tablet 25 mg  25 mg Oral TID PRN Sunday Corn, NP   25 mg at 04/01/23 0981   ziprasidone (GEODON) injection 20 mg  20 mg Intramuscular Q12H PRN Sunday Corn, NP       And   LORazepam (ATIVAN) tablet 1 mg  1 mg Oral PRN Sunday Corn, NP       magnesium hydroxide (MILK OF MAGNESIA) suspension 30 mL  30 mL Oral Daily PRN Sunday Corn, NP       nicotine (NICODERM CQ - dosed in mg/24 hours) patch 14 mg  14 mg Transdermal Daily Park Pope, MD   14 mg  at 04/01/23 0805   nicotine polacrilex (NICORETTE) gum 2 mg  2 mg Oral PRN Phineas Inches, MD       Oxcarbazepine (TRILEPTAL) tablet 300 mg  300 mg Oral Q12H Park Pope, MD   300 mg at 04/01/23 0805   potassium chloride SA (KLOR-CON M) CR tablet 40 mEq  40 mEq Oral BID Park Pope, MD   40 mEq at 04/01/23 0805   prazosin (MINIPRESS) capsule 2 mg  2 mg Oral QHS Park Pope, MD       traZODone (DESYREL) tablet 50 mg  50 mg Oral QHS PRN,MR X 1 Park Pope, MD       venlafaxine XR [facility]) 24 hr capsule 225 mg  225 mg Oral Daily Park Pope, MD   225 mg at 04/01/23 0805   ziprasidone (GEODON) capsule 20 mg  20 mg Oral Once PRN Ajibola, Ene A, NP        Lab Results:  Results for orders placed or performed during the hospital encounter of 03/30/23 (from the past 24 hour(s))  Basic metabolic panel     Status: Abnormal   Collection Time: 03/31/23  6:30 PM  Result Value Ref Range   Sodium 135 135 - 145 mmol/L   Potassium 3.1 (L) 3.5 - 5.1 mmol/L   Chloride 100 98 - 111 mmol/L   CO2 22 22 - 32 mmol/L   Glucose, Bld 110 (H) 70 - 99 mg/dL   BUN 12 6 - 20 mg/dL   Creatinine, Ser 1.61 0.44 - 1.00 mg/dL   Calcium 9.1 8.9 - 09.6 mg/dL   GFR, Estimated >04 >54 mL/min   Anion gap 13 5 - 15  Iron and TIBC     Status: None   Collection Time: 03/31/23  6:30 PM  Result Value Ref Range    Iron 53 28 - 170 ug/dL   TIBC 098 119 - 147 ug/dL   Saturation Ratios 12 10.4 - 31.8 %   UIBC 378 ug/dL  Reticulocytes     Status: None   Collection Time: 03/31/23  6:30 PM  Result Value Ref Range   Retic Ct Pct 1.4 0.4 - 3.1 %   RBC. 4.82 3.87 - 5.11 MIL/uL   Retic Count, Absolute 66.0 19.0 - 186.0 K/uL   Immature Retic Fract 12.0 2.3 - 15.9 %  Ferritin     Status: None   Collection Time: 03/31/23  6:30 PM  Result Value Ref Range   Ferritin 58 11 - 307 ng/mL  Vitamin B12     Status: None   Collection Time: 03/31/23  6:30 PM  Result Value Ref Range   Vitamin B-12 308 180 - 914 pg/mL    Blood Alcohol level:  Lab Results  Component Value Date   ETH <10 03/29/2023    Metabolic Disorder Labs: Lab Results  Component Value Date   HGBA1C 5.3 03/29/2023   MPG 105.41 03/29/2023   Lab Results  Component Value Date   PROLACTIN 4.3 (L) 03/29/2023   Lab Results  Component Value Date   CHOL 225 (H) 03/29/2023   TRIG 63 03/29/2023   HDL 52 03/29/2023   CHOLHDL 4.3 03/29/2023   VLDL 13 03/29/2023   LDLCALC 160 (H) 03/29/2023    Physical Findings:  Musculoskeletal: Strength & Muscle Tone: within normal limits Gait & Station: normal  Psychiatric Specialty Exam:  Presentation  General Appearance:  Appropriate for Environment; Casual   Eye Contact: Minimal   Speech: Slow   Speech Volume: Decreased  Handedness: Right    Mood and Affect  Mood: Depressed; Anxious   Affect: Tearful; Depressed    Thought Process  Thought Processes: Coherent; Goal Directed; Linear   Descriptions of Associations:Intact   Orientation:Full (Time, Place and Person)   Thought Content:Logical   History of Schizophrenia/Schizoaffective disorder:No   Duration of Psychotic Symptoms:No data recorded  Hallucinations:Hallucinations: None  Ideas of Reference:None   Suicidal Thoughts:Suicidal Thoughts: Yes, Passive  Homicidal Thoughts:Homicidal Thoughts:  No   Sensorium  Memory: Remote Good   Judgment: Poor   Insight: Fair    Chartered certified accountant: Fair   Attention Span: Fair   Recall: [employer] of Knowledge: Fair   Language: Fair    Psychomotor Activity  Psychomotor Activity: Psychomotor Activity: Normal   Assets  Assets: Communication Skills; Desire for Improvement; Financial Resources/Insurance; Housing; Physical Health; Resilience; Social Support    Sleep  Sleep: Sleep: Poor    Physical Exam: ROS Blood pressure 136/86, pulse (!) 101, temperature 98.4 F (36.9 C), temperature source Oral, resp. rate 18, height 5\' 3"  (1.6 m), weight 93.9 kg, last menstrual period 02/25/2023, SpO2 100 %. Body mass index is 36.67 kg/m.   ASSESSMENT AND PLAN  Chelsea Vargas is a 27 year old female with past psychiatric history of bipolar 2 disorder, MDD, anxiety, ADHD, PTSD, insomnia presenting voluntarily with stepfather initially evaluated at [facility] 03/29/23 and admitted to [facility] for worsening suicidal ideation and worsening depression. This is hospitalization day 2.  She continues to report severe depression although this is improving.  Her primary complaint at this point appears to be waking up in the morning very anxious.  Will continue to titrate up the prazosin as well as have the Effexor increased.  PLAN Safety and Monitoring: Voluntary admission to inpatient psychiatric unit for safety, stabilization and treatment Daily contact with patient to assess and evaluate symptoms and progress in treatment Patient's case to be discussed in multi-disciplinary team meeting Observation Level : q15 minute checks Vital signs: q12 hours Precautions: suicide, elopement, and assault   Psychiatric Problems Bipolar disorder, Type 2, depressed episode Cannabis use disorder Tobacco use disorder -Nicotine patch for NRT -Discontinued Celexa given she is already on a SNRI. -Increased prazosin to 2 mg  nightly for PTSD nightmares -Trazodone 50 mg nightly as needed and 1x more PRN for insomnia -Continue Trileptal 300 mg twice daily -10-hydroxycarbazepine level pending -Effexor increased to 225 mg daily for depression -- The risks/benefits/side-effects/alternatives to this medication were discussed in detail with the patient and time was given for questions. The patient consents to medication trial.  -- Metabolic profile and EKG monitoring obtained while on an atypical antipsychotic (BMI: Body mass index is 36.67 kg/m. Lipid Panel: LDL cholesterol 160 HbgA1c: 5.3 QTc: 451) -- Encouraged patient to participate in unit milieu and in scheduled group therapies   Medical Problems Prolonged menstrual cycle with heavy bleeding -Has nexplanon inserted -Outpatient OBGYN follow up -No signs of IDA  PRNs Tylenol 650 mg for mild pain Maalox/Mylanta 30 mL for indigestion Hydroxyzine 25 mg tid for anxiety Milk of Magnesia 30 mL for constipation Trazodone 50 mg for sleep   4. Discharge Planning: Social work and case management to assist with discharge planning and identification of hospital follow-up needs prior to discharge Estimated LOS: 5- days Discharge Concerns: Need to establish a safety plan; Medication compliance and effectiveness Discharge Goals: Return home with outpatient referrals for mental health follow-up including medication management/psychotherapy     Park Pope, MD 04/01/2023, 8:52 AM

## 2023-04-01 NOTE — [facility] Group Notes (Signed)
Adult Psychoeducational Group Note  Date:  04/01/2023 Time:  10:22 PM  Group Topic/Focus:  Wrap-Up Group:   The focus of this group is to help patients review their daily goal of treatment and discuss progress on daily workbooks.  Participation Level:  Active  Participation Quality:  Appropriate  Affect:  Appropriate  Cognitive:  Appropriate  Insight: Appropriate  Engagement in Group:  Engaged  Modes of Intervention:  Discussion and Support  Additional Comments:  Pt attended the evening group and responded to all discussion prompts from the Writer. Pt shared that today was a good day on the unit, the highlight of which was playing cornhole with her peers in the gym. On the subject of goals for the coming week, Pt mentioned wanting to discharge home as she misses her son. Pt rated today a 9 out of 10.  Christ Kick 04/01/2023, 10:22 PM

## 2023-04-02 ENCOUNTER — Encounter (HOSPITAL_COMMUNITY): Payer: Self-pay

## 2023-04-02 DIAGNOSIS — F332 Major depressive disorder, recurrent severe without psychotic features: Secondary | ICD-10-CM

## 2023-04-02 MED ORDER — HYDROXYZINE HCL 25 MG PO TABS
25.0000 mg | ORAL_TABLET | Freq: Every day | ORAL | Status: DC
  Administered 2023-04-02 – 2023-04-03 (×2): 25 mg via ORAL
  Filled 2023-04-02 (×4): qty 1

## 2023-04-02 NOTE — Group Note (Signed)
Recreation Therapy Group Note   Group Topic:Problem Solving  Group Date: 04/02/2023 Start Time: 0932 End Time: 0956 Facilitators: Glendora Clouatre-McCall, LRT,CTRS Location: [address] Dayroom   Goal Area(s) Addresses:  Patient will effectively work with peer towards shared goal.  Patient will identify skills used to make activity successful.  Patient will identify how skills used during activity can be applied to reach post d/c goals.   Group Description: [employer]. In teams of 5-6, patients were given 12 craft pipe cleaners. Using the materials provided, patients were instructed to compete again the opposing team(s) to build the tallest free-standing structure from floor level. The activity was timed; difficulty increased by Clinical research associate as Production designer, theatre/television/film continued.  Systematically resources were removed with additional directions for example, placing one arm behind their back, working in silence, and shape stipulations. LRT facilitated post-activity discussion reviewing team processes and necessary communication skills involved in completion. Patients were encouraged to reflect how the skills utilized, or not utilized, in this activity can be incorporated to positively impact support systems post discharge.   Affect/Mood: N/A   Participation Level: Did not attend    Clinical Observations/Individualized Feedback:     Plan: Continue to engage patient in RT group sessions 2-3x/week.   Jaymee Tilson-McCall, LRT,CTRS 04/02/2023 1:07 PM

## 2023-04-02 NOTE — Plan of Care (Signed)
  Problem: Education: Goal: Knowledge of the prescribed therapeutic regimen will improve Outcome: Progressing   

## 2023-04-02 NOTE — Group Note (Signed)
Occupational Therapy Group Note  Group Topic:Coping Skills  Group Date: 04/02/2023 Start Time: 1430 End Time: 1500 Facilitators: Ted Mcalpine, OT   Group Description: Group encouraged increased engagement and participation through discussion and activity focused on "Coping Ahead." Patients were split up into teams and selected a card from a stack of positive coping strategies. Patients were instructed to act out/charade the coping skill for other peers to guess and receive points for their team. Discussion followed with a focus on identifying additional positive coping strategies and patients shared how they were going to cope ahead over the weekend while continuing hospitalization stay.  Therapeutic Goal(s): Identify positive vs negative coping strategies. Identify coping skills to be used during hospitalization vs coping skills outside of hospital/at home Increase participation in therapeutic group environment and promote engagement in treatment   Participation Level: Engaged   Participation Quality: Independent   Behavior: Calm   Speech/Thought Process: Relevant   Affect/Mood: Appropriate   Insight: Fair   Judgement: Fair      Modes of Intervention: Education  Patient Response to Interventions:  Attentive   Plan: Continue to engage patient in OT groups 2 - 3x/week.  04/02/2023  Ted Mcalpine, OT Kerrin Champagne, OT

## 2023-04-02 NOTE — Progress Notes (Signed)
   04/02/23 2200  Psych Admission Type (Psych Patients Only)  Admission Status Voluntary  Psychosocial Assessment  Patient Complaints None  Eye Contact Fair  Facial Expression Animated  Affect Appropriate to circumstance  Speech Logical/coherent  Interaction Assertive  Motor Activity Other (Comment)  Appearance/Hygiene Unremarkable  Behavior Characteristics Appropriate to situation  Mood Pleasant  Thought Process  Coherency WDL  Content WDL  Delusions None reported or observed  Perception WDL  Hallucination None reported or observed  Judgment Poor  Confusion None  Danger to Self  Current suicidal ideation? Denies  Self-Injurious Behavior No self-injurious ideation or behavior indicators observed or expressed   Agreement Not to Harm Self Yes  Description of Agreement verbal  Danger to Others  Danger to Others None reported or observed   Alert/oriented. Makes needs/concerns known to staff. Pleasant cooperative with staff. Denies SI/HI/A/V hallucinations. Med compliant. Patient states went to group. Will encourage continued compliance and progression towards goals. Verbally contracted for safety. Will continue to monitor.

## 2023-04-02 NOTE — Progress Notes (Signed)
Adult Psychoeducational Group Note  Date:  04/02/2023 Time:  8:20 PM  Group Topic/Focus:  Wrap-Up Group:   The focus of this group is to help patients review their daily goal of treatment and discuss progress on daily workbooks.  Participation Level:  Active  Participation Quality:  Appropriate  Affect:  Appropriate  Cognitive:  Appropriate  Insight: Appropriate  Engagement in Group:  Engaged  Modes of Intervention:  Discussion  Additional Comments:  Attend AA wrap up group  Charna Busman Long 04/02/2023, 8:20 PM

## 2023-04-02 NOTE — BH IP Treatment Plan (Signed)
Interdisciplinary Treatment and Diagnostic Plan Update  04/02/2023 Time of Session: 0830 Chelsea Vargas MRN: [id number]  Principal Diagnosis: MDD (major depressive disorder), recurrent severe, without psychosis (HCC)  Secondary Diagnoses: Principal Problem:   MDD (major depressive disorder), recurrent severe, without psychosis (HCC)   Current Medications:  Current Facility-Administered Medications  Medication Dose Route Frequency Provider Last Rate Last Admin   acetaminophen (TYLENOL) tablet 650 mg  650 mg Oral Q6H PRN Sunday Corn, NP       alum & mag hydroxide-simeth (MAALOX/MYLANTA) 200-200-20 MG/5ML suspension 30 mL  30 mL Oral Q4H PRN Sunday Corn, NP       feeding supplement (ENSURE ENLIVE / ENSURE PLUS) liquid 237 mL  237 mL Oral BID BM Park Pope, MD   237 mL at 04/01/23 1045   hydrOXYzine (ATARAX) tablet 25 mg  25 mg Oral TID PRN Sunday Corn, NP   25 mg at 04/01/23 2119   ziprasidone (GEODON) injection 20 mg  20 mg Intramuscular Q12H PRN Sunday Corn, NP       And   LORazepam (ATIVAN) tablet 1 mg  1 mg Oral PRN Sunday Corn, NP       magnesium hydroxide (MILK OF MAGNESIA) suspension 30 mL  30 mL Oral Daily PRN Sunday Corn, NP       nicotine (NICODERM CQ - dosed in mg/24 hours) patch 14 mg  14 mg Transdermal Daily Park Pope, MD   14 mg at 04/01/23 0805   nicotine polacrilex (NICORETTE) gum 2 mg  2 mg Oral PRN Massengill, Harrold Donath, MD       Oxcarbazepine (TRILEPTAL) tablet 300 mg  300 mg Oral Q12H Park Pope, MD   300 mg at 04/02/23 0813   prazosin (MINIPRESS) capsule 2 mg  2 mg Oral Seabron Spates, MD   2 mg at 04/01/23 2119   traZODone (DESYREL) tablet 50 mg  50 mg Oral QHS PRN,MR X 1 Park Pope, MD   50 mg at 04/01/23 2119   venlafaxine XR (EFFEXOR-XR) 24 hr capsule 225 mg  225 mg Oral Daily Park Pope, MD   225 mg at 04/02/23 4098   ziprasidone (GEODON) capsule 20 mg  20 mg Oral Once PRN Ajibola, Ene A, NP       PTA Medications: Medications  Prior to Admission  Medication Sig Dispense Refill Last Dose   citalopram (CELEXA) 40 MG tablet Take 1 tablet (40 mg total) by mouth daily. 30 tablet 0    hydrOXYzine (VISTARIL) 50 MG capsule Take 1 capsule (50 mg total) by mouth 2 (two) times daily as needed for anxiety. 60 capsule 0    ondansetron (ZOFRAN-ODT) 8 MG disintegrating tablet Take 1 tablet (8 mg total) by mouth every 8 (eight) hours as needed for nausea or vomiting. 20 tablet 0    Oxcarbazepine (TRILEPTAL) 300 MG tablet Take 300-600 mg by mouth 2 (two) times daily. Take two tablets in the morning and one tablet in the evening.      venlafaxine XR (EFFEXOR XR) 75 MG 24 hr capsule Take 1 capsule (75 mg total) by mouth daily. 30 capsule 0     Patient Stressors: Financial difficulties   Marital or family conflict   Occupational concerns   Substance abuse    Patient Strengths: Average or above average intelligence  Capable of independent living  Forensic psychologist fund of knowledge  Motivation for treatment/growth  Physical Health  Supportive family/friends  Work skills  Treatment Modalities: Medication Management, Group therapy, Case management,  1 to 1 session with clinician, Psychoeducation, Recreational therapy.   Physician Treatment Plan for Primary Diagnosis: MDD (major depressive disorder), recurrent severe, without psychosis (HCC) Long Term Goal(s):     Short Term Goals:    Medication Management: Evaluate patient's response, side effects, and tolerance of medication regimen.  Therapeutic Interventions: 1 to 1 sessions, Unit Group sessions and Medication administration.  Evaluation of Outcomes: Progressing  Physician Treatment Plan for Secondary Diagnosis: Principal Problem:   MDD (major depressive disorder), recurrent severe, without psychosis (HCC)  Long Term Goal(s):     Short Term Goals:       Medication Management: Evaluate patient's response, side effects, and tolerance of medication  regimen.  Therapeutic Interventions: 1 to 1 sessions, Unit Group sessions and Medication administration.  Evaluation of Outcomes: Progressing   RN Treatment Plan for Primary Diagnosis: MDD (major depressive disorder), recurrent severe, without psychosis (HCC) Long Term Goal(s): Knowledge of disease and therapeutic regimen to maintain health will improve  Short Term Goals: Ability to remain free from injury will improve, Ability to verbalize frustration and anger appropriately will improve, Ability to demonstrate self-control, Ability to participate in decision making will improve, Ability to verbalize feelings will improve, Ability to disclose and discuss suicidal ideas, Ability to identify and develop effective coping behaviors will improve, and Compliance with prescribed medications will improve  Medication Management: RN will administer medications as ordered by provider, will assess and evaluate patient's response and provide education to patient for prescribed medication. RN will report any adverse and/or side effects to prescribing provider.  Therapeutic Interventions: 1 on 1 counseling sessions, Psychoeducation, Medication administration, Evaluate responses to treatment, Monitor vital signs and CBGs as ordered, Perform/monitor CIWA, COWS, AIMS and Fall Risk screenings as ordered, Perform wound care treatments as ordered.  Evaluation of Outcomes: Progressing   LCSW Treatment Plan for Primary Diagnosis: MDD (major depressive disorder), recurrent severe, without psychosis (HCC) Long Term Goal(s): Safe transition to appropriate next level of care at discharge, Engage patient in therapeutic group addressing interpersonal concerns.  Short Term Goals: Engage patient in aftercare planning with referrals and resources, Increase social support, Increase ability to appropriately verbalize feelings, Increase emotional regulation, Facilitate acceptance of mental health diagnosis and concerns,  Facilitate patient progression through stages of change regarding substance use diagnoses and concerns, Identify triggers associated with mental health/substance abuse issues, and Increase skills for wellness and recovery  Therapeutic Interventions: Assess for all discharge needs, 1 to 1 time with Social worker, Explore available resources and support systems, Assess for adequacy in community support network, Educate family and significant other(s) on suicide prevention, Complete Psychosocial Assessment, Interpersonal group therapy.  Evaluation of Outcomes: Progressing   Progress in Treatment: Attending groups: Yes. Participating in groups: Yes. Taking medication as prescribed: Yes. Toleration medication: Yes. Family/Significant other contact made: Yes, individual(s) contacted:  Suzzanne Cloud, mother, [phone] Patient understands diagnosis: Yes. Discussing patient identified problems/goals with staff: Yes. Medical problems stabilized or resolved: Yes. Denies suicidal/homicidal ideation: No. Issues/concerns per patient self-inventory: Yes. Other: none  New problem(s) identified: No, Describe:  none  New Short Term/Long Term Goal(s): Patient to work towards medication management for mood stabilization; elimination of SI thoughts; development of comprehensive mental wellness plan.  Patient Goals:  Patient states their goal for treatment is to "medication mangement."  Discharge Plan or Barriers: No psychosocial barriers identified at this time, patient to return to place of residence when appropriate for discharge.   Reason for  Continuation of Hospitalization: Depression Medication stabilization  Estimated Length of Stay: 1-7 days   Last 3 [place] Suicide Severity Risk Score: Flowsheet Row Ad[facility] (Current) [facility] from [facility]29/2023 in [facility] ED from 03/03/2023 in [facility] Emergency  Department at [facility] [place]  C-SSRS RISK CATEGORY High Risk High Risk No Risk       Scribe for Treatment Team: Almedia Balls 04/02/2023 3:38 PM

## 2023-04-02 NOTE — Progress Notes (Signed)
   04/02/23 1600  Psych Admission Type (Psych Patients Only)  Admission Status Voluntary  Psychosocial Assessment  Patient Complaints None  Eye Contact Fair  Facial Expression Animated  Affect Appropriate to circumstance  Speech Logical/coherent  Interaction Assertive  Motor Activity Other (Comment) (WDL)  Appearance/Hygiene Unremarkable  Behavior Characteristics Appropriate to situation  Mood Pleasant  Thought Process  Coherency WDL  Content WDL  Delusions None reported or observed  Perception WDL  Hallucination None reported or observed  Judgment Poor  Confusion None  Danger to Self  Current suicidal ideation? Denies  Self-Injurious Behavior No self-injurious ideation or behavior indicators observed or expressed   Agreement Not to Harm Self Yes  Description of Agreement verbal  Danger to Others  Danger to Others None reported or observed

## 2023-04-02 NOTE — Progress Notes (Addendum)
[facility] MD Progress Note  04/02/2023 9:04 AM JAVONTE ACHTERBERG  MRN:  [id number] Principal Problem: MDD (major depressive disorder), recurrent severe, without psychosis (HCC) Diagnosis: Principal Problem:   MDD (major depressive disorder), recurrent severe, without psychosis (HCC)  Reason for Admission:  Chelsea Vargas is a 27 year old female with past psychiatric history of bipolar 2 disorder, MDD, anxiety, ADHD, PTSD, insomnia presenting voluntarily with stepfather initially evaluated at [facility] 03/29/23 and admitted to [facility] for worsening suicidal ideation and worsening depression.   Information obtained from 24-hour nursing report: The patient was noted to do well on the unit, behaving appropriately with staff and with peers.  She reports that her anxiety has diminished.   Information Obtained Today During Patient Interview: The patient reports that she is doing well.  Her affect is bright.  She reports that she was very anxious initially, coming to the behavioral hospital.  She reports that she has now gotten used to the routine of groups.  She reports no nightmares since starting prazosin.  She also reports great benefit of using hydroxyzine before bed.  She was agreeable to schedule this medication.  She denies experiencing any suicidal thoughts.  Total Time spent with patient: 30 minutes  Past Psychiatric History:  Previous Psych Diagnoses: PTSD, depression, anxiety, insomnia,  Prior inpatient treatment:  Current/prior outpatient treatment: Started medications approximately 6 years ago after patient gave birth via C-section for postpartum depression.  Only recently was transitioned over to [facility]. Psychotherapy hx: Has only recently started [facility] therapy but reports that the therapist is nice.  She had at the former therapist that she did not feel compatible with and only went to briefly. History of suicide: Never attempted suicide outside of 2 weeks ago SIB: burned herself since a drug 17.  Will  occasionally stop but then relapse due to anxiety or severe depression History of homicide: Denies Psychiatric medication history:abilify (akathisia), zoloft (ineffective), zyprexa, seroquel (sleepy), "other mood stabilizers, antipsychotics, and antidepressants that I can't remember" Psychiatric medication compliance history:good Neuromodulation history: denies Current Psychiatrist: formally Dr. Maryruth Bun, now see someone at [facility] Current therapist: [facility] therapist  Past Medical History:  Past Medical History:  Diagnosis Date   Acne    iPledge #: [phone]   Anxiety    Bipolar disorder (HCC)    Depression    Family history of breast cancer    5/22 Genetic testing letter sent   Mental disorder    Bipolar    Past Surgical History:  Procedure Laterality Date   CESAREAN SECTION     TONSILLECTOMY     Family History:  Family History  Problem Relation Age of Onset   Breast cancer Paternal Grandmother    Breast cancer Maternal Aunt    Breast cancer Maternal Aunt    Breast cancer Other    Breast cancer Other 11   Rheum arthritis Mother    Cirrhosis Mother        non alcoholic   Diabetes Mother    Cancer Father    Family Psychiatric  History:  Father: bipolar disorder Uncle: bipolar disorder Mother: severe anxiety  Social History:  Social History   Substance and Sexual Activity  Alcohol Use Yes   Alcohol/week: 1.0 standard drink of alcohol   Types: 1 Standard drinks or equivalent per week   Comment: occassional     Social History   Substance and Sexual Activity  Drug Use Yes   Frequency: 7.0 times per week   Types: Marijuana    Social  History   Socioeconomic History   Marital status: Single    Spouse name: Not on file   Number of children: 1   Years of education: 18   Highest education level: 12th grade  Occupational History   Occupation: school  Tobacco Use   Smoking status: Never    Passive exposure: Never   Smokeless tobacco: Never   Tobacco comments:     THC since 27 yrs old, once daily, vapes  Vaping Use   Vaping Use: Every day   Start date: 03/29/2021  Substance and Sexual Activity   Alcohol use: Yes    Alcohol/week: 1.0 standard drink of alcohol    Types: 1 Standard drinks or equivalent per week    Comment: occassional   Drug use: Yes    Frequency: 7.0 times per week    Types: Marijuana   Sexual activity: Yes    Birth control/protection: None, Condom  Other Topics Concern   Not on file  Social History Narrative   Not on file   Social Determinants of Health   Financial Resource Strain: Not on file  Food Insecurity: No Food Insecurity (03/30/2023)   Hunger Vital Sign    Worried About Running Out of Food in the Last Year: Never true    Ran Out of Food in the Last Year: Never true  Transportation Needs: No Transportation Needs (03/30/2023)   PRAPARE - Administrator, Civil Service (Medical): No    Lack of Transportation (Non-Medical): No  Physical Activity: Not on file  Stress: Not on file  Social Connections: Not on file   Additional Social History:                         Current Medications: Current Facility-Administered Medications  Medication Dose Route Frequency Provider Last Rate Last Admin   acetaminophen (TYLENOL) tablet 650 mg  650 mg Oral Q6H PRN Sunday Corn, NP       alum & mag hydroxide-simeth (MAALOX/MYLANTA) 200-200-20 MG/5ML suspension 30 mL  30 mL Oral Q4H PRN Sunday Corn, NP       feeding supplement (ENSURE ENLIVE / ENSURE PLUS) liquid 237 mL  237 mL Oral BID BM Park Pope, MD   237 mL at 04/01/23 1045   hydrOXYzine (ATARAX) tablet 25 mg  25 mg Oral TID PRN Sunday Corn, NP   25 mg at 04/01/23 2119   ziprasidone (GEODON) injection 20 mg  20 mg Intramuscular Q12H PRN Sunday Corn, NP       And   LORazepam (ATIVAN) tablet 1 mg  1 mg Oral PRN Sunday Corn, NP       magnesium hydroxide (MILK OF MAGNESIA) suspension 30 mL  30 mL Oral Daily PRN Sunday Corn, NP        nicotine (NICODERM CQ - dosed in mg/24 hours) patch 14 mg  14 mg Transdermal Daily Park Pope, MD   14 mg at 04/01/23 0805   nicotine polacrilex (NICORETTE) gum 2 mg  2 mg Oral PRN Massengill, Harrold Donath, MD       Oxcarbazepine (TRILEPTAL) tablet 300 mg  300 mg Oral Q12H Park Pope, MD   300 mg at 04/02/23 0813   prazosin (MINIPRESS) capsule 2 mg  2 mg Oral QHS Park Pope, MD   2 mg at 04/01/23 2119   traZODone (DESYREL) tablet 50 mg  50 mg Oral QHS PRN,MR X 1 Park Pope, MD   50  mg at 04/01/23 2119   venlafaxine XR (EFFEXOR-XR) 24 hr capsule 225 mg  225 mg Oral Daily Park Pope, MD   225 mg at 04/02/23 1610   ziprasidone (GEODON) capsule 20 mg  20 mg Oral Once PRN Ajibola, Ene A, NP        Lab Results:  No results found for this or any previous visit (from the past 24 hour(s)).   Blood Alcohol level:  Lab Results  Component Value Date   ETH <10 03/29/2023    Metabolic Disorder Labs: Lab Results  Component Value Date   HGBA1C 5.3 03/29/2023   MPG 105.41 03/29/2023   Lab Results  Component Value Date   PROLACTIN 4.3 (L) 03/29/2023   Lab Results  Component Value Date   CHOL 225 (H) 03/29/2023   TRIG 63 03/29/2023   HDL 52 03/29/2023   CHOLHDL 4.3 03/29/2023   VLDL 13 03/29/2023   LDLCALC 160 (H) 03/29/2023   Psychiatric Specialty Exam: Physical Exam Constitutional:      Appearance: the patient is not toxic-appearing.  Pulmonary:     Effort: Pulmonary effort is normal.  Neurological:     General: No focal deficit present.     Mental Status: the patient is alert and oriented to person, place, and time.   Review of Systems  Respiratory:  Negative for shortness of breath.   Cardiovascular:  Negative for chest pain.  Gastrointestinal:  Negative for abdominal pain, constipation, diarrhea, nausea and vomiting.  Neurological:  Negative for headaches.      BP (!) 133/96 (BP Location: Left Arm)   Pulse (!) 104   Temp 98.5 F (36.9 C) (Oral)   Resp 18   Ht 5\' 3"  (1.6  m)   Wt 93.9 kg   LMP 02/25/2023 (Approximate)   SpO2 100%   BMI 36.67 kg/m   General Appearance: Fairly Groomed  Eye Contact:  Good  Speech:  Clear and Coherent  Volume:  Normal  Mood:  Euthymic  Affect:  Congruent  Thought Process:  Coherent  Orientation:  Full (Time, Place, and Person)  Thought Content: Logical   Suicidal Thoughts:  No  Homicidal Thoughts:  No  Memory:  Immediate;   Good  Judgement:  fair  Insight:  fair  Psychomotor Activity:  Normal  Concentration:  Concentration: Good  Recall:  Good  Fund of Knowledge: Good  Language: Good  Akathisia:  No  Handed:  not assessed  AIMS (if indicated): not done  Assets:  Communication Skills Desire for Improvement Financial Resources/Insurance Housing Leisure Time Physical Health  ADL's:  Intact  Cognition: WNL  Sleep:  Fair     ASSESSMENT AND PLAN  AMARRAH FAIN is a 27 year old female with past psychiatric history of bipolar 2 disorder, MDD, anxiety, ADHD, PTSD, insomnia presenting voluntarily with stepfather initially evaluated at [facility] 03/29/23 and admitted to [facility] for worsening suicidal ideation and worsening depression. This is hospitalization day 3.  She continues to report severe depression although this is improving.  Her primary complaint at this point appears to be waking up in the morning very anxious.  Will continue to titrate up the prazosin as well as have the Effexor increased.  PLAN Safety and Monitoring: Voluntary admission to inpatient psychiatric unit for safety, stabilization and treatment Daily contact with patient to assess and evaluate symptoms and progress in treatment Patient's case to be discussed in multi-disciplinary team meeting Observation Level : q15 minute checks Vital signs: q12 hours Precautions: suicide, elopement, and assault  Psychiatric Problems Bipolar disorder, Type 2, depressed episode Cannabis use disorder Tobacco use disorder -Nicotine patch for  NRT -Discontinued Celexa given she is already on a SNRI. - Schedule hydroxyzine 25 mg nightly for insomnia -Increased prazosin to 2 mg nightly for PTSD nightmares (increased 5/5) -Trazodone 50 mg nightly as needed and 1x more PRN for insomnia -Continue Trileptal 300 mg twice daily -10-hydroxycarbazepine level pending -Effexor increased to 225 mg daily for depression (increased 5/5) -- The risks/benefits/side-effects/alternatives to this medication were discussed in detail with the patient and time was given for questions. The patient consents to medication trial.  -- Metabolic profile and EKG monitoring obtained while on an atypical antipsychotic (BMI: Body mass index is 36.67 kg/m. Lipid Panel: LDL cholesterol 160 HbgA1c: 5.3 QTc: 451) -- Encouraged patient to participate in unit milieu and in scheduled group therapies   Medical Problems Prolonged menstrual cycle with heavy bleeding -Has nexplanon inserted -Outpatient OBGYN follow up -No signs of IDA  PRNs Tylenol 650 mg for mild pain Maalox/Mylanta 30 mL for indigestion Hydroxyzine 25 mg tid for anxiety Milk of Magnesia 30 mL for constipation Trazodone 50 mg for sleep   4. Discharge Planning: Social work and case management to assist with discharge planning and identification of hospital follow-up needs prior to discharge Estimated LOS: 5- days Discharge Concerns: Need to establish a safety plan; Medication compliance and effectiveness Discharge Goals: Return home with outpatient referrals for mental health follow-up including medication management/psychotherapy -Patient would benefit from [facility], but she currently works 2 part-time jobs, often in the daytime - Patient has psychiatrist and therapist at [employer], MD 04/02/2023, 9:04 AM

## 2023-04-02 NOTE — [facility] Group Notes (Signed)
Spirituality group facilitated by Kathleen Argue, BCC.  Group Description: Group focused on topic of hope. Patients participated in facilitated discussion around topic, connecting with one another around experiences and definitions for hope. Group members engaged with visual explorer photos, reflecting on what hope looks like for them today. Group engaged in discussion around how their definitions of hope are present today in hospital.  Modalities: Psycho-social ed, Adlerian, Narrative, MI  Patient Progress: Chelsea Vargas was present in group until she was called out by a provider.  She actively engaged and participated in group conversation and activities for the time that she was there.

## 2023-04-03 NOTE — Progress Notes (Signed)
Patient denies SI,HI, and A/V/H with no plan or intent. Patient med compliant and remains cooperative on unit.     04/03/23 0900  Psych Admission Type (Psych Patients Only)  Admission Status Voluntary  Psychosocial Assessment  Patient Complaints None  Eye Contact Fair  Facial Expression Animated  Affect Appropriate to circumstance  Speech Logical/coherent  Interaction Assertive  Motor Activity Other (Comment) (WDL)  Appearance/Hygiene Unremarkable  Behavior Characteristics Cooperative  Mood Pleasant  Thought Process  Coherency WDL  Content WDL  Delusions None reported or observed  Perception WDL  Hallucination None reported or observed  Judgment Limited  Confusion None  Danger to Self  Current suicidal ideation? Denies  Self-Injurious Behavior No self-injurious ideation or behavior indicators observed or expressed   Agreement Not to Harm Self Yes  Description of Agreement verbal  Danger to Others  Danger to Others None reported or observed

## 2023-04-03 NOTE — Progress Notes (Signed)
   04/03/23 2100  Psych Admission Type (Psych Patients Only)  Admission Status Voluntary  Psychosocial Assessment  Patient Complaints None  Eye Contact Fair  Facial Expression Animated  Affect Appropriate to circumstance  Speech Logical/coherent  Interaction Assertive  Motor Activity Slow  Appearance/Hygiene Unremarkable  Behavior Characteristics Cooperative;Appropriate to situation  Mood Pleasant  Thought Process  Coherency WDL  Content WDL  Delusions None reported or observed  Perception WDL  Hallucination None reported or observed  Judgment Poor  Confusion None  Danger to Self  Current suicidal ideation? Denies  Self-Injurious Behavior No self-injurious ideation or behavior indicators observed or expressed   Agreement Not to Harm Self Yes  Description of Agreement verbal  Danger to Others  Danger to Others None reported or observed

## 2023-04-03 NOTE — Group Note (Signed)
Recreation Therapy Group Note   Group Topic:Animal Assisted Therapy   Group Date: 04/03/2023 Start Time: 0945 End Time: 1035 Facilitators: Gamal Todisco-McCall, LRT,CTRS Location: [address] Dayroom   Animal-Assisted Activity (AAA) Program Checklist/Progress Notes Patient Eligibility Criteria Checklist & Daily Group note for Rec Tx Intervention  AAA/T Program Assumption of Risk Form signed by Patient/ or Parent Legal Guardian Yes  Patient is free of allergies or severe asthma Yes  Patient reports no fear of animals Yes  Patient reports no history of cruelty to animals Yes  Patient understands his/her participation is voluntary Yes  Patient washes hands before animal contact Yes  Patient washes hands after animal contact Yes   Affect/Mood: Appropriate   Participation Level: Engaged   Participation Quality: Independent   Behavior: Appropriate   Speech/Thought Process: Focused    Clinical Observations/Individualized Feedback:  Patient attended session and interacted appropriately with therapy dog and peers. Patient asked appropriate questions about therapy dog and his training. Patient shared stories about their pets at home with group.    Plan: Continue to engage patient in RT group sessions 2-3x/week.   Markise Haymer-McCall, LRT,CTRS 04/03/2023 2:09 PM

## 2023-04-03 NOTE — Progress Notes (Signed)
[facility] MD Progress Note  04/03/2023 1:27 PM Chelsea Vargas  MRN:  [id number] Principal Problem: MDD (major depressive disorder), recurrent severe, without psychosis (HCC) Diagnosis: Principal Problem:   MDD (major depressive disorder), recurrent severe, without psychosis (HCC)  Reason for Admission:  Chelsea Vargas is a 27 year old female with past psychiatric history of bipolar 2 disorder, MDD, anxiety, ADHD, PTSD, insomnia presenting voluntarily with stepfather initially evaluated at [facility] 03/29/23 and admitted to [facility] for worsening suicidal ideation and worsening depression.   Information obtained from 24-hour nursing report: Staff reports no behavioral problems.  She is partially compliant with groups.  She received trazodone at night in addition to hydroxyzine as needed.  She slept well.   Information Obtained Today During Patient Interview: The patient was seen today she was alert oriented and cooperative but lying in bed.  She appeared fairly pleasant and her affect was bright.  She continues to endorse some anxiety but overall reports feeling better.  She rates her depression at 2/10 anxiety at 1/10 and suicidal ideation 0/10.  She plans to stay with her mother upon discharge.  She has been working 2 separate jobs and plans to go back to working.  She denies any side effects.  When seen today she denied any active SI/HI/AVH.  The plan is to discharge her in the morning with a safety plan.  Total Time spent with patient: 30 minutes  Past Psychiatric History:  Previous Psych Diagnoses: PTSD, depression, anxiety, insomnia,  Prior inpatient treatment:  Current/prior outpatient treatment: Started medications approximately 6 years ago after patient gave birth via C-section for postpartum depression.  Only recently was transitioned over to [facility]. Psychotherapy hx: Has only recently started [facility] therapy but reports that the therapist is nice.  She had at the former therapist that she did not feel  compatible with and only went to briefly. History of suicide: Never attempted suicide outside of 2 weeks ago SIB: burned herself since a drug 17.  Will occasionally stop but then relapse due to anxiety or severe depression History of homicide: Denies Psychiatric medication history:abilify (akathisia), zoloft (ineffective), zyprexa, seroquel (sleepy), "other mood stabilizers, antipsychotics, and antidepressants that I can't remember" Psychiatric medication compliance history:good Neuromodulation history: denies Current Psychiatrist: formally Dr. Maryruth Bun, now see someone at [facility] Current therapist: [facility] therapist  Past Medical History:  Past Medical History:  Diagnosis Date   Acne    iPledge #: [phone]   Anxiety    Bipolar disorder (HCC)    Depression    Family history of breast cancer    5/22 Genetic testing letter sent   Mental disorder    Bipolar    Past Surgical History:  Procedure Laterality Date   CESAREAN SECTION     TONSILLECTOMY     Family History:  Family History  Problem Relation Age of Onset   Breast cancer Paternal Grandmother    Breast cancer Maternal Aunt    Breast cancer Maternal Aunt    Breast cancer Other    Breast cancer Other 61   Rheum arthritis Mother    Cirrhosis Mother        non alcoholic   Diabetes Mother    Cancer Father    Family Psychiatric  History:  Father: bipolar disorder Uncle: bipolar disorder Mother: severe anxiety  Social History:  Social History   Substance and Sexual Activity  Alcohol Use Yes   Alcohol/week: 1.0 standard drink of alcohol   Types: 1 Standard drinks or equivalent per week  Comment: occassional     Social History   Substance and Sexual Activity  Drug Use Yes   Frequency: 7.0 times per week   Types: Marijuana    Social History   Socioeconomic History   Marital status: Single    Spouse name: Not on file   Number of children: 1   Years of education: 12   Highest education level: 12th grade   Occupational History   Occupation: school  Tobacco Use   Smoking status: Never    Passive exposure: Never   Smokeless tobacco: Never   Tobacco comments:    THC since 27 yrs old, once daily, vapes  Vaping Use   Vaping Use: Every day   Start date: 03/29/2021  Substance and Sexual Activity   Alcohol use: Yes    Alcohol/week: 1.0 standard drink of alcohol    Types: 1 Standard drinks or equivalent per week    Comment: occassional   Drug use: Yes    Frequency: 7.0 times per week    Types: Marijuana   Sexual activity: Yes    Birth control/protection: None, Condom  Other Topics Concern   Not on file  Social History Narrative   Not on file   Social Determinants of Health   Financial Resource Strain: Not on file  Food Insecurity: No Food Insecurity (03/30/2023)   Hunger Vital Sign    Worried About Running Out of Food in the Last Year: Never true    Ran Out of Food in the Last Year: Never true  Transportation Needs: No Transportation Needs (03/30/2023)   PRAPARE - Administrator, Civil Service (Medical): No    Lack of Transportation (Non-Medical): No  Physical Activity: Not on file  Stress: Not on file  Social Connections: Not on file   Additional Social History:                         Current Medications: Current Facility-Administered Medications  Medication Dose Route Frequency Provider Last Rate Last Admin   acetaminophen (TYLENOL) tablet 650 mg  650 mg Oral Q6H PRN Sunday Corn, NP       alum & mag hydroxide-simeth (MAALOX/MYLANTA) 200-200-20 MG/5ML suspension 30 mL  30 mL Oral Q4H PRN Sunday Corn, NP       feeding supplement (ENSURE ENLIVE / ENSURE PLUS) liquid 237 mL  237 mL Oral BID BM Park Pope, MD   237 mL at 04/01/23 1045   hydrOXYzine (ATARAX) tablet 25 mg  25 mg Oral TID PRN Sunday Corn, NP   25 mg at 04/02/23 2141   hydrOXYzine (ATARAX) tablet 25 mg  25 mg Oral QHS Carlyn Reichert, MD   25 mg at 04/02/23 2100   ziprasidone  (GEODON) injection 20 mg  20 mg Intramuscular Q12H PRN Sunday Corn, NP       And   LORazepam (ATIVAN) tablet 1 mg  1 mg Oral PRN Sunday Corn, NP       magnesium hydroxide (MILK OF MAGNESIA) suspension 30 mL  30 mL Oral Daily PRN Sunday Corn, NP       nicotine (NICODERM CQ - dosed in mg/24 hours) patch 14 mg  14 mg Transdermal Daily Park Pope, MD   14 mg at 04/01/23 0805   nicotine polacrilex (NICORETTE) gum 2 mg  2 mg Oral PRN Massengill, Harrold Donath, MD       Oxcarbazepine (TRILEPTAL) tablet 300 mg  300 mg  Oral Q12H Park Pope, MD   300 mg at 04/03/23 0830   prazosin (MINIPRESS) capsule 2 mg  2 mg Oral QHS Park Pope, MD   2 mg at 04/02/23 2100   traZODone (DESYREL) tablet 50 mg  50 mg Oral QHS PRN,MR X 1 Park Pope, MD   50 mg at 04/02/23 2144   venlafaxine XR (EFFEXOR-XR) 24 hr capsule 225 mg  225 mg Oral Daily Park Pope, MD   225 mg at 04/03/23 0830   ziprasidone (GEODON) capsule 20 mg  20 mg Oral Once PRN Ajibola, Ene A, NP        Lab Results:  No results found for this or any previous visit (from the past 24 hour(s)).   Blood Alcohol level:  Lab Results  Component Value Date   ETH <10 03/29/2023    Metabolic Disorder Labs: Lab Results  Component Value Date   HGBA1C 5.3 03/29/2023   MPG 105.41 03/29/2023   Lab Results  Component Value Date   PROLACTIN 4.3 (L) 03/29/2023   Lab Results  Component Value Date   CHOL 225 (H) 03/29/2023   TRIG 63 03/29/2023   HDL 52 03/29/2023   CHOLHDL 4.3 03/29/2023   VLDL 13 03/29/2023   LDLCALC 160 (H) 03/29/2023   Psychiatric Specialty Exam: Physical Exam Constitutional:      Appearance: the patient is not toxic-appearing.  Pulmonary:     Effort: Pulmonary effort is normal.  Neurological:     General: No focal deficit present.     Mental Status: the patient is alert and oriented to person, place, and time.   Review of Systems  Respiratory:  Negative for shortness of breath.   Cardiovascular:  Negative for chest  pain.  Gastrointestinal:  Negative for abdominal pain, constipation, diarrhea, nausea and vomiting.  Neurological:  Negative for headaches.      BP 122/85 (BP Location: Right Arm)   Pulse (!) 123   Temp 98.4 F (36.9 C) (Oral)   Resp 18   Ht 5\' 3"  (1.6 m)   Wt 93.9 kg   SpO2 100%   BMI 36.67 kg/m   General Appearance: Fairly Groomed  Eye Contact:  Good  Speech:  Clear and Coherent  Volume:  Normal  Mood:  Euthymic  Affect:  Congruent  Thought Process:  Coherent  Orientation:  Full (Time, Place, and Person)  Thought Content: Logical   Suicidal Thoughts:  No  Homicidal Thoughts:  No  Memory:  Immediate;   Good  Judgement:  fair  Insight:  fair  Psychomotor Activity:  Normal  Concentration:  Concentration: Good  Recall:  Good  Fund of Knowledge: Good  Language: Good  Akathisia:  No  Handed:  not assessed  AIMS (if indicated): not done  Assets:  Communication Skills Desire for Improvement Financial Resources/Insurance Housing Leisure Time Physical Health  ADL's:  Intact  Cognition: WNL  Sleep:  Fair     ASSESSMENT AND PLAN  LAHOMA CONNICK is a 27 year old female with past psychiatric history of bipolar 2 disorder, MDD, anxiety, ADHD, PTSD, insomnia presenting voluntarily with stepfather initially evaluated at [facility] 03/29/23 and admitted to [facility] for worsening suicidal ideation and worsening depression. This is hospitalization day 4.  She continues to report gradually improving anxiety and depression.  Her sleep is improved although she continues to have early morning anxiety and apprehension.  Effexor and Minipress are being titrated.   PLAN Safety and Monitoring: Voluntary admission to inpatient psychiatric unit for safety, stabilization  and treatment Daily contact with patient to assess and evaluate symptoms and progress in treatment Patient's case to be discussed in multi-disciplinary team meeting Observation Level : q15 minute checks Vital signs: q12  hours Precautions: suicide, elopement, and assault   Psychiatric Problems Bipolar disorder, Type 2, depressed episode Cannabis use disorder Tobacco use disorder -Nicotine patch for NRT -Discontinued Celexa given she is already on a SNRI. - Schedule hydroxyzine 25 mg nightly for insomnia with good improvement of sleep. - prazosin to 2 mg nightly for PTSD nightmares (increased 5/5) -Trazodone 50 mg nightly as needed and 1x more PRN for insomnia -Continue Trileptal 300 mg twice daily -10-hydroxycarbazepine level pending -Effexor increased to 225 mg daily for depression (increased 5/5) -- The risks/benefits/side-effects/alternatives to this medication were discussed in detail with the patient and time was given for questions. The patient consents to medication trial.  -- Metabolic profile and EKG monitoring obtained while on an atypical antipsychotic (BMI: Body mass index is 36.67 kg/m. Lipid Panel: LDL cholesterol 160 HbgA1c: 5.3 QTc: 451) -- Encouraged patient to participate in unit milieu and in scheduled group therapies   Medical Problems Prolonged menstrual cycle with heavy bleeding -Has nexplanon inserted -Outpatient OBGYN follow up -No signs of IDA  PRNs Tylenol 650 mg for mild pain Maalox/Mylanta 30 mL for indigestion Hydroxyzine 25 mg tid for anxiety Milk of Magnesia 30 mL for constipation Trazodone 50 mg for sleep   4. Discharge Planning: Social work and case management to assist with discharge planning and identification of hospital follow-up needs prior to discharge Estimated LOS: Possible discharge on Wednesday, 04/04/2023. Discharge Concerns: Need to establish a safety plan; Medication compliance and effectiveness Discharge Goals: Return home with outpatient referrals for mental health follow-up including medication management/psychotherapy -Patient would benefit from [facility], but she currently works 2 part-time jobs, often in the daytime - Patient has psychiatrist and  therapist at [facility]    Rex Kras, MD 04/03/2023, 1:27 PMPatient ID: Chelsea Vargas, female   DOB: [birth year], 27 y.o.   MRN: [id number]

## 2023-04-03 NOTE — Group Note (Signed)
Date:  04/03/2023 Time:  10:49 AM  Group Topic/Focus:  Goals Group:   The focus of this group is to help patients establish daily goals to achieve during treatment and discuss how the patient can incorporate goal setting into their daily lives to aide in recovery. Orientation:   The focus of this group is to educate the patient on the purpose and policies of crisis stabilization and provide a format to answer questions about their admission.  The group details unit policies and expectations of patients while admitted.    Participation Level:  Did Not Attend  Participation Quality:  Appropriate  Affect:  Appropriate  Cognitive:  Appropriate  Insight: Appropriate  Engagement in Group:  Engaged  Modes of Intervention:  Discussion  Additional Comments:  Patient attended group and was attentive the duration of it. Patient's goal was to get a discharge plan.    Chelsea Vargas [name] 04/03/2023, 10:49 AM

## 2023-04-03 NOTE — Group Note (Signed)
LCSW Group Therapy Note  Group Date: 04/03/2023 Start Time: 1100 End Time: 1150   Type of Therapy and Topic:  Group Therapy - Healthy vs Unhealthy Coping Skills Dealing with Depression   Participation Level:  Active   Description of Group The focus of this group was to determine what unhealthy coping techniques typically are used by group members and what healthy coping techniques would be helpful in coping with various problems. Patients were guided in becoming aware of the differences between healthy and unhealthy coping techniques. Patients were asked to identify 2-3 healthy coping skills they would like to learn to use more effectively.  Therapeutic Goals Patients learned that coping is what human beings do all day long to deal with various situations in their lives Patients defined and discussed healthy vs unhealthy coping techniques Patients identified their preferred coping techniques and identified whether these were healthy or unhealthy Patients determined 2-3 healthy coping skills they would like to become more familiar with and use more often. Patients provided support and ideas to each other   Summary of Patient Progress:  During group, patient expressed how she clocks into work early or keep herself busy when she is dealing with depression. Patient proved open to input from peers and feedback from CSW. Patient demonstrated positive insight into the subject matter, was respectful of peers, and participated throughout the entire session.   Therapeutic Modalities Cognitive Behavioral Therapy Motivational Interviewing  Isabella Bowens, LCSWA 04/03/2023  3:10 PM

## 2023-04-03 NOTE — Plan of Care (Signed)
  Problem: Education: Goal: Ability to make informed decisions regarding treatment will improve Outcome: Progressing   Problem: Education: Goal: Emotional status will improve Outcome: Progressing   Problem: Activity: Goal: Interest or engagement in activities will improve Outcome: Progressing Goal: Sleeping patterns will improve Outcome: Progressing   Problem: Health Behavior/Discharge Planning: Goal: Compliance with treatment plan for underlying cause of condition will improve Outcome: Progressing   Problem: Safety: Goal: Periods of time without injury will increase Outcome: Progressing

## 2023-04-04 LAB — 10-HYDROXYCARBAZEPINE: Triliptal/MTB(Oxcarbazepin): 11 ug/mL (ref 10–35)

## 2023-04-04 MED ORDER — PRAZOSIN HCL 2 MG PO CAPS
2.0000 mg | ORAL_CAPSULE | Freq: Every day | ORAL | 2 refills | Status: DC
  Filled 2023-05-14: qty 30, 30d supply, fill #0
  Filled 2023-06-18 – 2023-10-03 (×3): qty 30, 30d supply, fill #1

## 2023-04-04 MED ORDER — NICOTINE 14 MG/24HR TD PT24: 14.0000 mg | MEDICATED_PATCH | Freq: Every day | TRANSDERMAL | 0 refills | Status: DC

## 2023-04-04 MED ORDER — HYDROXYZINE HCL 25 MG PO TABS
25.0000 mg | ORAL_TABLET | Freq: Three times a day (TID) | ORAL | 1 refills | Status: AC | PRN
  Filled 2023-05-14: qty 90, 30d supply, fill #0

## 2023-04-04 MED ORDER — VENLAFAXINE HCL ER 75 MG PO CP24
225.0000 mg | ORAL_CAPSULE | Freq: Every day | ORAL | 1 refills | Status: AC
  Filled 2023-05-14: qty 90, 30d supply, fill #0
  Filled 2023-10-02 – 2023-10-03 (×2): qty 90, 30d supply, fill #1

## 2023-04-04 MED ORDER — HYDROXYZINE HCL 25 MG PO TABS: 25.0000 mg | ORAL_TABLET | Freq: Three times a day (TID) | ORAL | 0 refills | Status: DC | PRN

## 2023-04-04 MED ORDER — OXCARBAZEPINE 300 MG PO TABS: 300.0000 mg | ORAL_TABLET | Freq: Two times a day (BID) | ORAL | 0 refills | Status: AC

## 2023-04-04 NOTE — BHH Suicide Risk Assessment (Signed)
[facility] Discharge Suicide Risk Assessment   Principal Problem: Bipolar II disorder [facility]) Discharge Diagnoses: Principal Problem:   Bipolar II disorder (HCC)    Total Time spent with patient: 15 min   During the patient's hospitalization, patient had extensive initial psychiatric evaluation, and follow-up psychiatric evaluations every day.  Upon evaluation, psychiatric diagnoses were given as follows:  Bipolar disorder, Type 2, depressed episode Cannabis use disorder Tobacco use disorder  Patient's psychiatric medications were adjusted on admission:  Discontinue Celexa Continue Effexor 225 mg daily Start prazosin 1 mg nightly for PTSD nightmares Restart Trileptal 300 mg twice daily  During the hospitalization, other adjustments were made to the patient's psychiatric medication regimen:  Prazosin was increased to 2 mg nightly for PTSD nightmares, which the patient says helped significantly Hydroxyzine was added nightly for insomnia  Gradually, patient started adjusting to milieu.   Patient's care was discussed during the interdisciplinary team meeting every day during the hospitalization.  The patient denied having side effects to prescribed psychiatric medication.  The patient reports their target psychiatric symptoms of depression responded well to the psychiatric medications, and the patient reports overall benefit other psychiatric hospitalization. Supportive psychotherapy was provided to the patient. The patient also participated in regular group therapy while admitted.   Labs were reviewed with the patient, and abnormal results were discussed with the patient.  The patient denied having suicidal thoughts more than 48 hours prior to discharge.  Patient denies having homicidal thoughts.  Patient denies having auditory hallucinations.  Patient denies any visual hallucinations.  Patient denies having paranoid thoughts.  The patient is able to verbalize their individual safety  plan to this provider.  It is recommended to the patient to continue psychiatric medications as prescribed, after discharge from the hospital.    It is recommended to the patient to follow up with your outpatient psychiatric provider and PCP.  Discussed with the patient, the impact of alcohol, drugs, tobacco have been there overall psychiatric and medical wellbeing, and total abstinence from substance use was recommended the patient.    Psychiatric Specialty Exam: Physical Exam Constitutional:      Appearance: the patient is not toxic-appearing.  Pulmonary:     Effort: Pulmonary effort is normal.  Neurological:     General: No focal deficit present.     Mental Status: the patient is alert and oriented to person, place, and time.   Review of Systems  Respiratory:  Negative for shortness of breath.   Cardiovascular:  Negative for chest pain.  Gastrointestinal:  Negative for abdominal pain, constipation, diarrhea, nausea and vomiting.  Neurological:  Negative for headaches.      BP 127/84 (BP Location: Left Arm)   Pulse 93   Temp 98.3 F (36.8 C) (Oral)   Resp 18   Ht 5\' 3"  (1.6 m)   Wt 93.9 kg   SpO2 100%   BMI 36.67 kg/m   General Appearance: Fairly Groomed  Eye Contact:  Good  Speech:  Clear and Coherent  Volume:  Normal  Mood:  Euthymic  Affect:  Congruent  Thought Process:  Coherent  Orientation:  Full (Time, Place, and Person)  Thought Content: Logical   Suicidal Thoughts:  No  Homicidal Thoughts:  No  Memory:  Immediate;   Good  Judgement:  fair  Insight:  fair  Psychomotor Activity:  Normal  Concentration:  Concentration: Good  Recall:  Good  Fund of Knowledge: Good  Language: Good  Akathisia:  No  Handed:  not assessed  AIMS (if indicated): not done  Assets:  Communication Skills Desire for Improvement Financial Resources/Insurance Housing Leisure Time Physical Health  ADL's:  Intact  Cognition: WNL  Sleep:  Fair     Mental Status Per  Nursing Assessment::   On Admission:  Suicidal ideation indicated by patient, Self-harm behaviors, Self-harm thoughts  Demographic Factors:  NA  Loss Factors: NA  Historical Factors: NA  Risk Reduction Factors:   Employed, Living with another person, especially a relative, Positive social support, Positive therapeutic relationship, and Positive coping skills or problem solving skills  Continued Clinical Symptoms:  Depression:   Severe  Cognitive Features That Contribute To Risk:  None    Suicide Risk:  mild   Follow-up Information     [facility] Call on 04/04/2023.   Why: Please follow up with this provider, a voicemail was left on your behalf to [facility] regarding your follow up needs. However, the receptionist stated that she could not assist with scheduling a medication or therapy appointment without speaking with [facility]. Contact information: [address] [place] [address] [phone]                 Plan Of Care/Follow-up recommendations:  Activity as tolerated. Diet as recommended by PCP. Keep all scheduled follow-up appointments as recommended.  Patient is instructed to take all prescribed medications as recommended. Report any side effects or adverse reactions to your outpatient psychiatrist. Patient is instructed to abstain from alcohol and illegal drugs while on prescription medications. In the event of worsening symptoms, patient is instructed to call the crisis hotline, 911, or go to the nearest emergency department for evaluation and treatment.  Prescriptions given at discharge. Patient agreeable to plan. Given opportunity to ask questions. Appears to feel comfortable with discharge.  Patient is also instructed prior to discharge to: Take all medications as prescribed by mental healthcare provider. Report any adverse effects and or reactions from the medicines to outpatient provider promptly. Patient has been  instructed & cautioned: To not engage in alcohol and or illegal drug use while on prescription medicines. In the event of worsening symptoms,  patient is instructed to call the crisis hotline, 911 and or go to the nearest ED for appropriate evaluation and treatment of symptoms. To follow-up with primary care provider for other medical issues, concerns and or health care needs  The patient was evaluated each day by a clinical provider to ascertain response to treatment. Improvement was noted by the patient's report of decreasing symptoms, improved sleep and appetite, affect, medication tolerance, behavior, and participation in unit programming.  Patient was asked each day to complete a self inventory noting mood, mental status, pain, new symptoms, anxiety and concerns.  Patient responded well to medication and being in a therapeutic and supportive environment. Positive and appropriate behavior was noted and the patient was motivated for recovery. The patient worked closely with the treatment team and case manager to develop a discharge plan with appropriate goals. Coping skills, problem solving as well as relaxation therapies were also part of the unit programming.  By the day of discharge patient was in much improved condition than upon admission.  Symptoms were reported as significantly decreased or resolved completely. The patient was motivated to continue taking medication with a goal of continued improvement in mental health.     Carlyn Reichert, MD PGY-2

## 2023-04-04 NOTE — [facility] Group Notes (Signed)
[facility] Group Notes:  (Nursing/MHT/Case Management/Adjunct)  Date:  04/04/2023  Time:  1:21 AM  Type of Therapy:   Wrap- up group  Participation Level:  Active  Participation Quality:  Appropriate  Affect:  Appropriate  Cognitive:  Appropriate  Insight:  Appropriate  Engagement in Group:  Engaged  Modes of Intervention:  Education  Summary of Progress/Problems: Pt goal was to have a good day, Pt reports she did have a good day. Day 9/10.  Noah Delaine 04/04/2023, 1:21 AM

## 2023-04-04 NOTE — Discharge Summary (Signed)
Physician Discharge Summary Note  Patient:  Chelsea Vargas is an 27 y.o., female MRN:  [id number] DOB:  [birth year] Patient phone:  [phone] (home)  Patient address:   [address] [place] [address],  Total Time spent with patient: 15 min  Date of Admission:  03/30/2023 Date of Discharge: 04/04/2023  Reason for Admission:   Chelsea Vargas is a 27 year old female with past psychiatric history of bipolar 2 disorder, MDD, anxiety, ADHD, PTSD, insomnia presenting voluntarily with stepfather initially evaluated at [facility] 03/29/23 and admitted to [facility] for worsening suicidal ideation and worsening depression.   Principal Problem: Bipolar II disorder [facility]) Discharge Diagnoses: Principal Problem:   Bipolar II disorder (HCC)    Past Psychiatric History: See H and P  Past Medical History:  Past Medical History:  Diagnosis Date   Acne    iPledge #: [phone]   Anxiety    Bipolar disorder (HCC)    Depression    Family history of breast cancer    5/22 Genetic testing letter sent   Mental disorder    Bipolar    Past Surgical History:  Procedure Laterality Date   CESAREAN SECTION     TONSILLECTOMY     Family History:  Family History  Problem Relation Age of Onset   Breast cancer Paternal Grandmother    Breast cancer Maternal Aunt    Breast cancer Maternal Aunt    Breast cancer Other    Breast cancer Other 68   Rheum arthritis Mother    Cirrhosis Mother        non alcoholic   Diabetes Mother    Cancer Father    Family Psychiatric  History: See H and P Social History:  Social History   Substance and Sexual Activity  Alcohol Use Yes   Alcohol/week: 1.0 standard drink of alcohol   Types: 1 Standard drinks or equivalent per week   Comment: occassional     Social History   Substance and Sexual Activity  Drug Use Yes   Frequency: 7.0 times per week   Types: Marijuana    Social History   Socioeconomic History   Marital status: Single    Spouse name: Not on  file   Number of children: 1   Years of education: 12   Highest education level: 12th grade  Occupational History   Occupation: school  Tobacco Use   Smoking status: Never    Passive exposure: Never   Smokeless tobacco: Never   Tobacco comments:    THC since 27 yrs old, once daily, vapes  Vaping Use   Vaping Use: Every day   Start date: 03/29/2021  Substance and Sexual Activity   Alcohol use: Yes    Alcohol/week: 1.0 standard drink of alcohol    Types: 1 Standard drinks or equivalent per week    Comment: occassional   Drug use: Yes    Frequency: 7.0 times per week    Types: Marijuana   Sexual activity: Yes    Birth control/protection: None, Condom  Other Topics Concern   Not on file  Social History Narrative   Not on file   Social Determinants of Health   Financial Resource Strain: Not on file  Food Insecurity: No Food Insecurity (03/30/2023)   Hunger Vital Sign    Worried About Running Out of Food in the Last Year: Never true    Ran Out of Food in the Last Year: Never true  Transportation Needs: No Transportation Needs (03/30/2023)   PRAPARE -  Administrator, Civil Service (Medical): No    Lack of Transportation (Non-Medical): No  Physical Activity: Not on file  Stress: Not on file  Social Connections: Not on file    Hospital Course:   Upon evaluation, psychiatric diagnoses were given as follows:  Bipolar disorder, Type 2, depressed episode Cannabis use disorder Tobacco use disorder   Patient's psychiatric medications were adjusted on admission:  Discontinue Celexa Continue Effexor 225 mg daily Start prazosin 1 mg nightly for PTSD nightmares Restart Trileptal 300 mg twice daily   During the hospitalization, other adjustments were made to the patient's psychiatric medication regimen:  Prazosin was increased to 2 mg nightly for PTSD nightmares, which the patient says helped significantly Hydroxyzine was added nightly for insomnia   Gradually, patient  started adjusting to milieu.   Patient's care was discussed during the interdisciplinary team meeting every day during the hospitalization.   The patient denied having side effects to prescribed psychiatric medication.   The patient reports their target psychiatric symptoms of depression responded well to the psychiatric medications, and the patient reports overall benefit other psychiatric hospitalization. Supportive psychotherapy was provided to the patient. The patient also participated in regular group therapy while admitted.    Labs were reviewed with the patient, and abnormal results were discussed with the patient.   The patient denied having suicidal thoughts more than 48 hours prior to discharge.  Patient denies having homicidal thoughts.  Patient denies having auditory hallucinations.  Patient denies any visual hallucinations.  Patient denies having paranoid thoughts.   The patient is able to verbalize their individual safety plan to this provider.   It is recommended to the patient to continue psychiatric medications as prescribed, after discharge from the hospital.     It is recommended to the patient to follow up with your outpatient psychiatric provider and PCP.   Discussed with the patient, the impact of alcohol, drugs, tobacco have been there overall psychiatric and medical wellbeing, and total abstinence from substance use was recommended the patient.   Physical Findings:     Psychiatric Specialty Exam: Physical Exam Constitutional:      Appearance: the patient is not toxic-appearing.  Pulmonary:     Effort: Pulmonary effort is normal.  Neurological:     General: No focal deficit present.     Mental Status: the patient is alert and oriented to person, place, and time.   Review of Systems  Respiratory:  Negative for shortness of breath.   Cardiovascular:  Negative for chest pain.  Gastrointestinal:  Negative for abdominal pain, constipation, diarrhea, nausea and  vomiting.  Neurological:  Negative for headaches.      BP 127/84 (BP Location: Left Arm)   Pulse 93   Temp 98.3 F (36.8 C) (Oral)   Resp 18   Ht 5\' 3"  (1.6 m)   Wt 93.9 kg   SpO2 100%   BMI 36.67 kg/m   General Appearance: Fairly Groomed  Eye Contact:  Good  Speech:  Clear and Coherent  Volume:  Normal  Mood:  Euthymic  Affect:  Congruent  Thought Process:  Coherent  Orientation:  Full (Time, Place, and Person)  Thought Content: Logical   Suicidal Thoughts:  No  Homicidal Thoughts:  No  Memory:  Immediate;   Good  Judgement:  fair  Insight:  fair  Psychomotor Activity:  Normal  Concentration:  Concentration: Good  Recall:  Good  Fund of Knowledge: Good  Language: Good  Akathisia:  No  Handed:  not assessed  AIMS (if indicated): not done  Assets:  Communication Skills Desire for Improvement Financial Resources/Insurance Housing Leisure Time Physical Health  ADL's:  Intact  Cognition: WNL  Sleep:  Fair      Social History   Tobacco Use  Smoking Status Never   Passive exposure: Never  Smokeless Tobacco Never  Tobacco Comments   THC since 27 yrs old, once daily, vapes   Tobacco Cessation:  A prescription for an FDA-approved tobacco cessation medication provided at discharge   Blood Alcohol level:  Lab Results  Component Value Date   ETH <10 03/29/2023    Metabolic Disorder Labs:  Lab Results  Component Value Date   HGBA1C 5.3 03/29/2023   MPG 105.41 03/29/2023   Lab Results  Component Value Date   PROLACTIN 4.3 (L) 03/29/2023   Lab Results  Component Value Date   CHOL 225 (H) 03/29/2023   TRIG 63 03/29/2023   HDL 52 03/29/2023   CHOLHDL 4.3 03/29/2023   VLDL 13 03/29/2023   LDLCALC 160 (H) 03/29/2023    See Psychiatric Specialty Exam and Suicide Risk Assessment completed by Attending Physician prior to discharge.  Discharge destination: self-care  Is patient on multiple antipsychotic therapies at discharge:  no Has Patient had  three or more failed trials of antipsychotic monotherapy by history:  no  Recommended Plan for Multiple Antipsychotic Therapies: NA  Discharge Instructions     Diet - low sodium heart healthy   Complete by: As directed    Increase activity slowly   Complete by: As directed       Allergies as of 04/04/2023       Reactions   Semaglutide Swelling, Rash, Other (See Comments)   Swollen eyes        Medication List     STOP taking these medications    citalopram 40 MG tablet Commonly known as: CeleXA   hydrOXYzine 50 MG capsule Commonly known as: VISTARIL   ondansetron 8 MG disintegrating tablet Commonly known as: ZOFRAN-ODT       TAKE these medications      Indication  hydrOXYzine 25 MG tablet Commonly known as: ATARAX Take 1 tablet (25 mg total) by mouth 3 (three) times daily as needed for anxiety.  Indication: Feeling Anxious   hydrOXYzine 25 MG tablet Commonly known as: ATARAX Take 1 tablet (25 mg total) by mouth at bedtime.  Indication: Feeling Anxious   nicotine 14 mg/24hr patch Commonly known as: NICODERM CQ - dosed in mg/24 hours Place 1 patch (14 mg total) onto the skin daily. Start taking on: Apr 05, 2023  Indication: Nicotine Addiction   Oxcarbazepine 300 MG tablet Commonly known as: TRILEPTAL Take 1 tablet (300 mg total) by mouth every 12 (twelve) hours. What changed:  how much to take when to take this additional instructions  Indication: mood swings   prazosin 2 MG capsule Commonly known as: MINIPRESS Take 1 capsule (2 mg total) by mouth at bedtime.  Indication: Frightening Dreams   venlafaxine XR 75 MG 24 hr capsule Commonly known as: EFFEXOR-XR Take 3 capsules (225 mg total) by mouth daily. Start taking on: Apr 05, 2023 What changed: how much to take  Indication: Major Depressive Disorder        Follow-up Information     [facility] Call on 04/04/2023.   Why: Please follow up with this provider, a voicemail was  left on your behalf to Lajuana Matte regarding your follow  up needs. However, the receptionist stated that she could not assist with scheduling a medication or therapy appointment without speaking with Angie Directly. Contact information: [address] [place] [address] [phone]                 Follow-up recommendations:  Activity as tolerated. Diet as recommended by PCP. Keep all scheduled follow-up appointments as recommended.  Patient is instructed to take all prescribed medications as recommended. Report any side effects or adverse reactions to your outpatient psychiatrist. Patient is instructed to abstain from alcohol and illegal drugs while on prescription medications. In the event of worsening symptoms, patient is instructed to call the crisis hotline, 911, or go to the nearest emergency department for evaluation and treatment.  Prescriptions given at discharge. Patient agreeable to plan. Given opportunity to ask questions. Appears to feel comfortable with discharge.  Patient is also instructed prior to discharge to: Take all medications as prescribed by mental healthcare provider. Report any adverse effects and or reactions from the medicines to outpatient provider promptly. Patient has been instructed & cautioned: To not engage in alcohol and or illegal drug use while on prescription medicines. In the event of worsening symptoms,  patient is instructed to call the crisis hotline, 911 and or go to the nearest ED for appropriate evaluation and treatment of symptoms. To follow-up with primary care provider for other medical issues, concerns and or health care needs  The patient was evaluated each day by a clinical provider to ascertain response to treatment. Improvement was noted by the patient's report of decreasing symptoms, improved sleep and appetite, affect, medication tolerance, behavior, and participation in unit programming.  Patient was asked each day to  complete a self inventory noting mood, mental status, pain, new symptoms, anxiety and concerns.  Patient responded well to medication and being in a therapeutic and supportive environment. Positive and appropriate behavior was noted and the patient was motivated for recovery. The patient worked closely with the treatment team and case manager to develop a discharge plan with appropriate goals. Coping skills, problem solving as well as relaxation therapies were also part of the unit programming.  By the day of discharge patient was in much improved condition than upon admission.  Symptoms were reported as significantly decreased or resolved completely. The patient was motivated to continue taking medication with a goal of continued improvement in mental health.    Comments:  As above  Signed: Carlyn Reichert, MD PGY-2

## 2023-04-04 NOTE — Plan of Care (Signed)
Nurse discussed anxiety, depression and coping skills with patient.  

## 2023-04-04 NOTE — [facility] Group Notes (Signed)
Spiritual care group on grief and loss facilitated by Chaplain Dyanne Carrel, Bcc  Group Goal: Support / Education around grief and loss  Members engage in facilitated group support and psycho-social education.  Group Description:  Following introductions and group rules, group members engaged in facilitated group dialogue and support around topic of loss, with particular support around experiences of loss in their lives. Group Identified types of loss (relationships / self / things) and identified patterns, circumstances, and changes that precipitate losses. Reflected on thoughts / feelings around loss, normalized grief responses, and recognized variety in grief experience. Group encouraged individual reflection on safe space and on the coping skills that they are already utilizing.  Group drew on Adlerian / Rogerian and narrative framework  Patient Progress: Chelsea Vargas attended group and actively engaged in group conversation and activities.  She shared about her experiences of grief and her comments demonstrated good insight.

## 2023-04-04 NOTE — Progress Notes (Addendum)
D:  Patient's self inventory sheet, patient sleeps good, no sleep medicine.  Good appetite, normal energy level, good concentration.  Denied depression, hopeless 1, anxiety 2.  No withdrawals.   Denied SI.  Denied physical problems.  Denied physical pain.  Goal is stay positive.  Plans to keep positive thoughts.  Does have discharge plans. A:  Medications administered per MD orders.  Emotional support and encouragement.  R:  Denied SI and HI, contracts for safety.  Denied A/V hallucinations.   Safety maintained with 15 minute checks.

## 2023-04-04 NOTE — Group Note (Signed)
Recreation Therapy Group Note   Group Topic:Team Building  Group Date: 04/04/2023 Start Time: 0934 End Time: 1010 Facilitators: Dashaun Onstott-McCall, LRT,CTRS Location: [address] Dayroom   Goal Area(s) Addresses:  Patient will effectively work with peer towards shared goal.  Patient will identify skills used to make activity successful.  Patient will identify how skills used during activity can be used to reach post d/c goals.   Group Description: Landing Pad. In teams of 3-5, patients were given 12 plastic drinking straws and an equal length of masking tape. Using the materials provided, patients were asked to build a landing pad to catch a golf ball dropped from approximately 5 feet in the air. All materials were required to be used by the team in their design. LRT facilitated post-activity discussion.   Affect/Mood: Appropriate   Participation Level: Engaged   Participation Quality: Independent   Behavior: Appropriate   Speech/Thought Process: Focused   Insight: Good   Judgement: Good   Modes of Intervention: STEM Activity   Patient Response to Interventions:  Engaged   Education Outcome:  Acknowledges education   Clinical Observations/Individualized Feedback: Pt attended and participated in group session.    Plan: Continue to engage patient in RT group sessions 2-3x/week.   Roena Sassaman-McCall, LRT,CTRS 04/04/2023 1:14 PM

## 2023-04-04 NOTE — Progress Notes (Signed)
Discharge Note:  Patient discharged home with family members.  Suicide prevention information given and discussed with patient who stated she understood and had no questions.  Patient denied SI and HI.  Denied A/V hallucinations.  Patient stated she received all her belongings, clothing, toiletries, misc items, etc.  Patient stated she appreciated all assistance received from [facility] staff.  All required discharge information given.

## 2023-04-16 ENCOUNTER — Encounter: Payer: Self-pay | Admitting: Obstetrics and Gynecology

## 2023-04-16 ENCOUNTER — Ambulatory Visit (INDEPENDENT_AMBULATORY_CARE_PROVIDER_SITE_OTHER): Admitting: Obstetrics and Gynecology

## 2023-04-16 ENCOUNTER — Other Ambulatory Visit (HOSPITAL_COMMUNITY)
Admission: RE | Admit: 2023-04-16 | Discharge: 2023-04-16 | Disposition: A | Discharge status: Home / Self Care | Payer: Commercial Managed Care - PPO | Source: Ambulatory Visit | Attending: Obstetrics and Gynecology | Admitting: Obstetrics and Gynecology

## 2023-04-16 VITALS — BP 118/70 | Ht 64.0 in | Wt 211.0 lb

## 2023-04-16 DIAGNOSIS — Z202 Contact with and (suspected) exposure to infections with a predominantly sexual mode of transmission: Secondary | ICD-10-CM | POA: Diagnosis not present

## 2023-04-16 DIAGNOSIS — Z113 Encounter for screening for infections with a predominantly sexual mode of transmission: Secondary | ICD-10-CM | POA: Insufficient documentation

## 2023-04-16 DIAGNOSIS — N898 Other specified noninflammatory disorders of vagina: Secondary | ICD-10-CM | POA: Insufficient documentation

## 2023-04-16 MED ORDER — AZITHROMYCIN 500 MG PO TABS
1000.0000 mg | ORAL_TABLET | Freq: Once | ORAL | 0 refills | Status: AC
Start: 2023-04-16 — End: 2023-04-16

## 2023-04-16 NOTE — Progress Notes (Signed)
[facility], Print production planner Complaint  Patient presents with   Pelvic Pain    Std testing, boyfriend tested pos for chlamydia   Vaginal odor    Abnormal, irriation, no discharge or itching    HPI:      [name] is a 27 y.o. G2P1011 whose LMP was Patient's last menstrual period was 02/27/2023 (approximate)., presents today for STD testing due to chlamydia exposure with partner. Just found out. Has had crampy pelvic pain for about a wk. No meds taken. Had irregular bleeding last month but also has nexplanon. Has had vaginal odor, no itching/irritation. No urin sx. No pain/bleeding/dryness with sex. No fevers.  Neg pap/STD testing 5/22  Patient Active Problem List   Diagnosis Date Noted   Suicidal ideation 03/29/2023   MDD (major depressive disorder), recurrent episode, severe (HCC) 03/29/2023   Supervision of normal pregnancy 03/29/2021   Marijuana use 03/29/2021   BMI 35.0-35.9,adult 03/29/2021   Bipolar II disorder (HCC) 10/07/2018   Hydrosalpinx 01/03/2018    Past Surgical History:  Procedure Laterality Date   CESAREAN SECTION     TONSILLECTOMY      Family History  Problem Relation Age of Onset   Breast cancer Paternal Grandmother    Breast cancer Maternal Aunt    Breast cancer Maternal Aunt    Breast cancer Other    Breast cancer Other 36   Rheum arthritis Mother    Cirrhosis Mother        non alcoholic   Diabetes Mother    Cancer Father     Social History   Socioeconomic History   Marital status: Single    Spouse name: Not on file   Number of children: 1   Years of education: 12   Highest education level: 12th grade  Occupational History   Occupation: school  Tobacco Use   Smoking status: Never    Passive exposure: Never   Smokeless tobacco: Never   Tobacco comments:    THC since 27 yrs old, once daily, vapes  Vaping Use   Vaping Use: Every day   Start date: 03/29/2021  Substance and Sexual Activity   Alcohol use: Yes     Alcohol/week: 1.0 standard drink of alcohol    Types: 1 Standard drinks or equivalent per week    Comment: occassional   Drug use: Yes    Frequency: 7.0 times per week    Types: Marijuana   Sexual activity: Yes    Birth control/protection: Implant  Other Topics Concern   Not on file  Social History Narrative   Not on file   Social Determinants of Health   Financial Resource Strain: Not on file  Food Insecurity: No Food Insecurity (03/30/2023)   Hunger Vital Sign    Worried About Running Out of Food in the Last Year: Never true    Ran Out of Food in the Last Year: Never true  Transportation Needs: No Transportation Needs (03/30/2023)   PRAPARE - Administrator, Civil Service (Medical): No    Lack of Transportation (Non-Medical): No  Physical Activity: Not on file  Stress: Not on file  Social Connections: Not on file  Intimate Partner Violence: Not At Risk (03/30/2023)   Humiliation, Afraid, Rape, and Kick questionnaire    Fear of Current or Ex-Partner: No    Emotionally Abused: No    Physically Abused: No    Sexually Abused: No    Outpatient Medications Prior to Visit  Medication Sig Dispense Refill   hydrOXYzine (ATARAX) 25 MG tablet Take 1 tablet (25 mg total) by mouth at bedtime. 30 tablet 0   Oxcarbazepine (TRILEPTAL) 300 MG tablet Take 1 tablet (300 mg total) by mouth every 12 (twelve) hours. 60 tablet 0   prazosin (MINIPRESS) 2 MG capsule Take 1 capsule (2 mg total) by mouth at bedtime. 30 capsule 0   venlafaxine XR (EFFEXOR-XR) 75 MG 24 hr capsule Take 3 capsules (225 mg total) by mouth daily. 90 capsule 0   hydrOXYzine (ATARAX) 25 MG tablet Take 1 tablet (25 mg total) by mouth 3 (three) times daily as needed for anxiety. 30 tablet 0   nicotine (NICODERM CQ - DOSED IN MG/24 HOURS) 14 mg/24hr patch Place 1 patch (14 mg total) onto the skin daily. 28 patch 0   No facility-administered medications prior to visit.      ROS:  Review of Systems   Constitutional:  Negative for fever.  Gastrointestinal:  Negative for blood in stool, constipation, diarrhea, nausea and vomiting.  Genitourinary:  Positive for pelvic pain. Negative for dyspareunia, dysuria, flank pain, frequency, hematuria, urgency, vaginal bleeding, vaginal discharge and vaginal pain.  Musculoskeletal:  Negative for back pain.  Skin:  Negative for rash.   BREAST: No symptoms   OBJECTIVE:   Vitals:  BP 118/70   Ht 5\' 4"  (1.626 m)   Wt 211 lb (95.7 kg)   LMP 02/27/2023 (Approximate)   BMI 36.22 kg/m   Physical Exam Vitals reviewed.  Constitutional:      Appearance: She is well-developed.  Pulmonary:     Effort: Pulmonary effort is normal.  Genitourinary:    General: Normal vulva.     Pubic Area: No rash.      Labia:        Right: No rash, tenderness or lesion.        Left: No rash, tenderness or lesion.      Vagina: Vaginal discharge present. No erythema, tenderness or bleeding.     Cervix: No cervical motion tenderness.     Uterus: Normal. Tender. Not enlarged.      Adnexa: Right adnexa normal and left adnexa normal.       Right: No mass or tenderness.         Left: No mass or tenderness.    Musculoskeletal:        General: Normal range of motion.     Cervical back: Normal range of motion.  Skin:    General: Skin is warm and dry.  Neurological:     General: No focal deficit present.     Mental Status: She is alert and oriented to person, place, and time.  Psychiatric:        Mood and Affect: Mood normal.        Behavior: Behavior normal.        Thought Content: Thought content normal.        Judgment: Judgment normal.    Assessment/Plan: Chlamydia contact - Plan: Cervicovaginal ancillary only, azithromycin (ZITHROMAX) 500 MG tablet; check culture, treat empirically with Rx azithro. Will f/u with results. No sex until a wk after both have completed tx.   Vaginal odor - Plan: Cervicovaginal ancillary only  Screening for STD (sexually  transmitted disease) - Plan: Cervicovaginal ancillary only    Meds ordered this encounter  Medications   azithromycin (ZITHROMAX) 500 MG tablet    Sig: Take 2 tablets (1,000 mg total) by mouth once for 1 dose.  Dispense:  2 tablet    Refill:  0    Order Specific Question:   Supervising Provider    Answer:   Hildred Laser [[id number]]      Return if symptoms worsen or fail to improve.  Moriyah Byington B. Vyom Brass, PA-C 04/16/2023 3:45 PM

## 2023-04-18 LAB — CERVICOVAGINAL ANCILLARY ONLY
Chlamydia: POSITIVE — AB
Comment: NEGATIVE
Comment: NEGATIVE
Comment: NORMAL
Neisseria Gonorrhea: NEGATIVE
Trichomonas: NEGATIVE

## 2023-04-18 NOTE — Progress Notes (Signed)
Psl notify [facility]. Thx.

## 2023-04-19 NOTE — Progress Notes (Signed)
[facility] notified. 

## 2023-05-14 ENCOUNTER — Other Ambulatory Visit: Payer: Self-pay

## 2023-05-14 MED ORDER — TRAZODONE HCL 50 MG PO TABS: 50.0000 mg | ORAL_TABLET | Freq: Every evening | ORAL | 2 refills | Status: AC | PRN

## 2023-05-14 MED ORDER — CITALOPRAM HYDROBROMIDE 40 MG PO TABS
40.0000 mg | ORAL_TABLET | Freq: Every day | ORAL | 1 refills | Status: AC
  Filled 2023-06-04: qty 30, 30d supply, fill #0

## 2023-05-14 MED ORDER — HYDROXYZINE PAMOATE 50 MG PO CAPS
50.0000 mg | ORAL_CAPSULE | Freq: Two times a day (BID) | ORAL | 1 refills | Status: AC | PRN
  Filled 2023-06-04: qty 28, 14d supply, fill #0

## 2023-06-04 ENCOUNTER — Other Ambulatory Visit: Payer: Self-pay

## 2023-06-18 ENCOUNTER — Other Ambulatory Visit: Payer: Self-pay

## 2023-06-18 MED ORDER — VENLAFAXINE HCL ER 75 MG PO CP24
225.0000 mg | ORAL_CAPSULE | Freq: Every morning | ORAL | 0 refills | Status: AC
  Filled 2023-06-18: qty 27, 9d supply, fill #0

## 2023-06-18 MED ORDER — OXCARBAZEPINE 300 MG PO TABS
ORAL_TABLET | ORAL | 0 refills | Status: DC
  Filled 2023-06-18: qty 27, 9d supply, fill #0

## 2023-06-18 MED ORDER — HYDROXYZINE HCL 25 MG PO TABS
25.0000 mg | ORAL_TABLET | Freq: Three times a day (TID) | ORAL | 0 refills | Status: AC | PRN
  Filled 2023-06-18: qty 27, 9d supply, fill #0

## 2023-06-18 MED ORDER — PRAZOSIN HCL 2 MG PO CAPS
2.0000 mg | ORAL_CAPSULE | Freq: Every evening | ORAL | 0 refills | Status: AC
  Filled 2023-06-18 (×2): qty 9, 9d supply, fill #0

## 2023-07-02 ENCOUNTER — Other Ambulatory Visit: Payer: Self-pay

## 2023-07-02 MED ORDER — OXCARBAZEPINE 300 MG PO TABS
ORAL_TABLET | ORAL | 1 refills | Status: AC
  Filled 2023-07-02: qty 90, 30d supply, fill #0
  Filled 2023-08-30 (×3): qty 90, 30d supply, fill #1

## 2023-07-02 MED ORDER — VENLAFAXINE HCL ER 75 MG PO CP24
225.0000 mg | ORAL_CAPSULE | Freq: Every morning | ORAL | 1 refills | Status: AC
  Filled 2023-07-02: qty 90, 30d supply, fill #0
  Filled 2023-08-30 (×2): qty 90, 30d supply, fill #1

## 2023-07-02 MED ORDER — HYDROXYZINE HCL 25 MG PO TABS
25.0000 mg | ORAL_TABLET | Freq: Three times a day (TID) | ORAL | 1 refills | Status: AC | PRN
  Filled 2023-07-02: qty 90, 30d supply, fill #0

## 2023-07-02 MED ORDER — PRAZOSIN HCL 2 MG PO CAPS
2.0000 mg | ORAL_CAPSULE | Freq: Every day | ORAL | 1 refills | Status: AC
  Filled 2023-07-02: qty 30, 30d supply, fill #0
  Filled 2023-08-30 (×2): qty 30, 30d supply, fill #1

## 2023-07-04 ENCOUNTER — Other Ambulatory Visit: Payer: Self-pay

## 2023-08-30 ENCOUNTER — Other Ambulatory Visit: Payer: Self-pay

## 2023-10-02 ENCOUNTER — Other Ambulatory Visit: Payer: Self-pay

## 2023-10-03 ENCOUNTER — Other Ambulatory Visit: Payer: Self-pay

## 2023-10-04 ENCOUNTER — Other Ambulatory Visit: Payer: Self-pay

## 2023-10-05 ENCOUNTER — Other Ambulatory Visit: Payer: Self-pay

## 2023-10-06 ENCOUNTER — Other Ambulatory Visit: Payer: Self-pay

## 2023-10-08 ENCOUNTER — Other Ambulatory Visit: Payer: Self-pay

## 2023-10-09 ENCOUNTER — Other Ambulatory Visit: Payer: Self-pay

## 2023-10-09 MED ORDER — VENLAFAXINE HCL ER 75 MG PO CP24
225.0000 mg | ORAL_CAPSULE | Freq: Every day | ORAL | 1 refills | Status: AC
  Filled 2023-11-07 – 2024-02-07 (×7): qty 90, 30d supply, fill #0
  Filled 2024-03-10 – 2024-04-08 (×2): qty 90, 30d supply, fill #1

## 2023-10-09 MED ORDER — HYDROXYZINE HCL 25 MG PO TABS
25.0000 mg | ORAL_TABLET | Freq: Three times a day (TID) | ORAL | 1 refills | Status: AC
  Filled 2023-10-09: qty 90, 30d supply, fill #0
  Filled 2023-11-07: qty 90, 30d supply, fill #1

## 2023-10-09 MED ORDER — PRAZOSIN HCL 2 MG PO CAPS
2.0000 mg | ORAL_CAPSULE | Freq: Every evening | ORAL | 1 refills | Status: AC
  Filled 2023-11-07: qty 30, 30d supply, fill #0
  Filled 2023-12-10 – 2024-02-07 (×3): qty 30, 30d supply, fill #1

## 2023-10-09 MED ORDER — OXCARBAZEPINE 300 MG PO TABS
ORAL_TABLET | ORAL | 1 refills | Status: DC
  Filled 2023-10-09: qty 90, 30d supply, fill #0
  Filled 2023-11-07: qty 90, 30d supply, fill #1

## 2023-11-07 ENCOUNTER — Encounter: Payer: Self-pay | Admitting: Pharmacist

## 2023-11-07 ENCOUNTER — Other Ambulatory Visit: Payer: Self-pay

## 2023-11-08 ENCOUNTER — Other Ambulatory Visit: Payer: Self-pay

## 2023-11-08 MED ORDER — VENLAFAXINE HCL ER 75 MG PO CP24
225.0000 mg | ORAL_CAPSULE | Freq: Every morning | ORAL | 1 refills | Status: AC
  Filled 2023-11-08: qty 90, 30d supply, fill #0
  Filled 2024-01-07 – 2024-03-10 (×5): qty 90, 30d supply, fill #1

## 2023-12-03 ENCOUNTER — Other Ambulatory Visit: Payer: Self-pay

## 2023-12-03 MED ORDER — PRAZOSIN HCL 2 MG PO CAPS
2.0000 mg | ORAL_CAPSULE | Freq: Every day | ORAL | 2 refills | Status: DC
  Filled 2023-12-03: qty 30, 30d supply, fill #0
  Filled 2024-01-07 (×3): qty 30, 30d supply, fill #1
  Filled 2024-02-07 – 2024-03-10 (×3): qty 30, 30d supply, fill #2

## 2023-12-03 MED ORDER — HYDROXYZINE HCL 25 MG PO TABS
25.0000 mg | ORAL_TABLET | Freq: Three times a day (TID) | ORAL | 2 refills | Status: AC | PRN
  Filled 2023-12-03: qty 90, 30d supply, fill #0
  Filled 2024-07-25: qty 90, 30d supply, fill #1

## 2023-12-03 MED ORDER — VENLAFAXINE HCL ER 75 MG PO CP24
225.0000 mg | ORAL_CAPSULE | Freq: Every morning | ORAL | 1 refills | Status: DC
  Filled 2023-12-03: qty 90, 30d supply, fill #0
  Filled 2024-01-07: qty 60, 20d supply, fill #1
  Filled 2024-01-07 (×2): qty 30, 10d supply, fill #1
  Filled 2024-01-07: qty 60, 20d supply, fill #1

## 2023-12-03 MED ORDER — OXCARBAZEPINE 300 MG PO TABS
ORAL_TABLET | ORAL | 0 refills | Status: DC
  Filled 2023-12-03: qty 90, 30d supply, fill #0

## 2023-12-10 ENCOUNTER — Other Ambulatory Visit: Payer: Self-pay

## 2023-12-11 ENCOUNTER — Other Ambulatory Visit: Payer: Self-pay

## 2023-12-28 ENCOUNTER — Other Ambulatory Visit: Payer: Self-pay

## 2024-01-07 ENCOUNTER — Other Ambulatory Visit: Payer: Self-pay

## 2024-01-08 ENCOUNTER — Other Ambulatory Visit: Payer: Self-pay

## 2024-01-08 ENCOUNTER — Other Ambulatory Visit (HOSPITAL_COMMUNITY): Payer: Self-pay

## 2024-02-07 ENCOUNTER — Other Ambulatory Visit: Payer: Self-pay

## 2024-02-08 ENCOUNTER — Other Ambulatory Visit: Payer: Self-pay

## 2024-02-11 ENCOUNTER — Other Ambulatory Visit: Payer: Self-pay

## 2024-02-12 ENCOUNTER — Other Ambulatory Visit: Payer: Self-pay

## 2024-02-12 MED ORDER — OXCARBAZEPINE 300 MG PO TABS
ORAL_TABLET | ORAL | 1 refills | Status: DC
  Filled 2024-02-12 (×2): qty 90, 30d supply, fill #0
  Filled 2024-03-10 (×4): qty 90, 30d supply, fill #1

## 2024-03-10 ENCOUNTER — Other Ambulatory Visit: Payer: Self-pay

## 2024-03-10 ENCOUNTER — Other Ambulatory Visit (HOSPITAL_COMMUNITY): Payer: Self-pay

## 2024-03-20 ENCOUNTER — Other Ambulatory Visit: Payer: Self-pay

## 2024-03-20 MED ORDER — OXCARBAZEPINE 300 MG PO TABS
ORAL_TABLET | ORAL | 2 refills | Status: DC
Start: 2024-03-20 — End: 2024-07-25
  Filled 2024-03-20 – 2024-04-08 (×2): qty 90, 30d supply, fill #0
  Filled 2024-05-20 (×2): qty 90, 30d supply, fill #1
  Filled 2024-06-23 (×2): qty 90, 30d supply, fill #2

## 2024-03-20 MED ORDER — PRAZOSIN HCL 2 MG PO CAPS
2.0000 mg | ORAL_CAPSULE | Freq: Every evening | ORAL | 2 refills | Status: DC
Start: 2024-03-20 — End: 2024-07-25
  Filled 2024-03-20 – 2024-04-08 (×2): qty 30, 30d supply, fill #0
  Filled 2024-05-20 (×2): qty 30, 30d supply, fill #1
  Filled 2024-06-23 (×2): qty 30, 30d supply, fill #2

## 2024-03-20 MED ORDER — VENLAFAXINE HCL ER 75 MG PO CP24
225.0000 mg | ORAL_CAPSULE | Freq: Every morning | ORAL | 2 refills | Status: DC
Start: 2024-03-20 — End: 2024-07-25
  Filled 2024-03-20 – 2024-04-08 (×2): qty 90, 30d supply, fill #0
  Filled 2024-05-20 (×2): qty 90, 30d supply, fill #1
  Filled 2024-06-23 (×2): qty 90, 30d supply, fill #2

## 2024-03-20 MED ORDER — HYDROXYZINE HCL 25 MG PO TABS
25.0000 mg | ORAL_TABLET | Freq: Three times a day (TID) | ORAL | 2 refills | Status: AC | PRN
  Filled 2024-03-20 – 2024-04-28 (×2): qty 90, 30d supply, fill #0
  Filled 2024-06-11: qty 90, 30d supply, fill #1
  Filled 2024-07-25 (×2): qty 90, 30d supply, fill #2

## 2024-03-31 ENCOUNTER — Other Ambulatory Visit: Payer: Self-pay

## 2024-04-08 ENCOUNTER — Other Ambulatory Visit: Payer: Self-pay

## 2024-04-09 ENCOUNTER — Other Ambulatory Visit: Payer: Self-pay

## 2024-04-28 ENCOUNTER — Other Ambulatory Visit: Payer: Self-pay

## 2024-05-20 ENCOUNTER — Other Ambulatory Visit: Payer: Self-pay

## 2024-06-12 ENCOUNTER — Other Ambulatory Visit: Payer: Self-pay

## 2024-06-23 ENCOUNTER — Other Ambulatory Visit: Payer: Self-pay

## 2024-06-24 ENCOUNTER — Other Ambulatory Visit: Payer: Self-pay

## 2024-07-24 ENCOUNTER — Other Ambulatory Visit: Payer: Self-pay

## 2024-07-25 ENCOUNTER — Other Ambulatory Visit: Payer: Self-pay

## 2024-07-25 MED ORDER — PRAZOSIN HCL 2 MG PO CAPS
2.0000 mg | ORAL_CAPSULE | Freq: Every day | ORAL | 0 refills | Status: DC
  Filled 2024-07-25: qty 30, 30d supply, fill #0

## 2024-07-25 MED ORDER — OXCARBAZEPINE 300 MG PO TABS
ORAL_TABLET | ORAL | 0 refills | Status: DC
  Filled 2024-07-25: qty 90, 30d supply, fill #0

## 2024-07-25 MED ORDER — VENLAFAXINE HCL ER 75 MG PO CP24
225.0000 mg | ORAL_CAPSULE | Freq: Every morning | ORAL | 0 refills | Status: DC
  Filled 2024-07-25: qty 90, 30d supply, fill #0

## 2024-07-25 MED ORDER — HYDROXYZINE HCL 25 MG PO TABS
25.0000 mg | ORAL_TABLET | Freq: Three times a day (TID) | ORAL | 0 refills | Status: AC | PRN
  Filled 2024-07-25: qty 90, 30d supply, fill #0

## 2024-07-31 ENCOUNTER — Other Ambulatory Visit: Payer: Self-pay

## 2024-08-05 ENCOUNTER — Other Ambulatory Visit: Payer: Self-pay

## 2024-08-05 MED ORDER — OXCARBAZEPINE 300 MG PO TABS
ORAL_TABLET | ORAL | 0 refills | Status: AC
  Filled 2024-08-27: qty 90, 30d supply, fill #0

## 2024-08-05 MED ORDER — PRAZOSIN HCL 2 MG PO CAPS
2.0000 mg | ORAL_CAPSULE | Freq: Every day | ORAL | 0 refills | Status: DC
  Filled 2024-08-27: qty 30, 30d supply, fill #0

## 2024-08-05 MED ORDER — VENLAFAXINE HCL ER 75 MG PO CP24
225.0000 mg | ORAL_CAPSULE | ORAL | 0 refills | Status: AC
  Filled 2024-08-27: qty 90, 30d supply, fill #0

## 2024-08-05 MED ORDER — HYDROXYZINE HCL 25 MG PO TABS: 25.0000 mg | ORAL_TABLET | Freq: Three times a day (TID) | ORAL | 0 refills | Status: AC | PRN

## 2024-08-27 ENCOUNTER — Other Ambulatory Visit: Payer: Self-pay

## 2024-09-29 ENCOUNTER — Other Ambulatory Visit: Payer: Self-pay

## 2024-09-30 ENCOUNTER — Other Ambulatory Visit: Payer: Self-pay

## 2024-10-01 ENCOUNTER — Other Ambulatory Visit: Payer: Self-pay

## 2024-10-02 ENCOUNTER — Other Ambulatory Visit: Payer: Self-pay

## 2024-10-02 MED ORDER — OXCARBAZEPINE 300 MG PO TABS
ORAL_TABLET | ORAL | 2 refills | Status: AC
  Filled 2024-10-02: qty 90, 30d supply, fill #0
  Filled 2024-12-02: qty 90, 30d supply, fill #1
  Filled 2024-12-30: qty 90, 30d supply, fill #2

## 2024-10-02 MED ORDER — VENLAFAXINE HCL ER 75 MG PO CP24
225.0000 mg | ORAL_CAPSULE | Freq: Every morning | ORAL | 2 refills | Status: AC
  Filled 2024-10-02: qty 90, 30d supply, fill #0
  Filled 2024-12-02: qty 90, 30d supply, fill #1
  Filled 2024-12-30: qty 90, 30d supply, fill #2

## 2024-10-02 MED ORDER — HYDROXYZINE HCL 25 MG PO TABS
25.0000 mg | ORAL_TABLET | Freq: Three times a day (TID) | ORAL | 2 refills | Status: AC | PRN
  Filled 2024-10-02: qty 90, 30d supply, fill #0

## 2024-10-02 MED ORDER — PRAZOSIN HCL 2 MG PO CAPS
2.0000 mg | ORAL_CAPSULE | Freq: Every day | ORAL | 2 refills | Status: AC
  Filled 2024-10-02: qty 30, 30d supply, fill #0
  Filled 2024-12-02: qty 30, 30d supply, fill #1
  Filled 2024-12-30: qty 30, 30d supply, fill #2

## 2024-11-04 ENCOUNTER — Other Ambulatory Visit: Payer: Self-pay

## 2024-11-04 MED ORDER — HYDROXYZINE HCL 25 MG PO TABS
25.0000 mg | ORAL_TABLET | Freq: Three times a day (TID) | ORAL | 2 refills | Status: AC | PRN
  Filled 2024-11-04: qty 90, 30d supply, fill #0

## 2024-11-04 MED ORDER — VENLAFAXINE HCL ER 75 MG PO CP24
225.0000 mg | ORAL_CAPSULE | Freq: Every morning | ORAL | 2 refills | Status: AC
  Filled 2024-11-04: qty 90, 30d supply, fill #0
  Filled 2024-12-02 – 2024-12-30 (×2): qty 90, 30d supply, fill #1

## 2024-11-04 MED ORDER — OXCARBAZEPINE 300 MG PO TABS
ORAL_TABLET | ORAL | 2 refills | Status: AC
  Filled 2024-11-04: qty 90, 30d supply, fill #0
  Filled 2024-12-02 – 2024-12-30 (×2): qty 90, 30d supply, fill #1

## 2024-11-04 MED ORDER — PRAZOSIN HCL 2 MG PO CAPS
2.0000 mg | ORAL_CAPSULE | Freq: Every day | ORAL | 2 refills | Status: AC
  Filled 2024-11-04: qty 30, 30d supply, fill #0

## 2024-12-02 ENCOUNTER — Other Ambulatory Visit: Payer: Self-pay

## 2024-12-03 ENCOUNTER — Other Ambulatory Visit: Payer: Self-pay

## 2024-12-04 ENCOUNTER — Other Ambulatory Visit: Payer: Self-pay

## 2024-12-30 ENCOUNTER — Other Ambulatory Visit: Payer: Self-pay

## 2024-12-31 ENCOUNTER — Other Ambulatory Visit: Payer: Self-pay
